# Patient Record
Sex: Male | Born: 1970 | Race: Black or African American | Hispanic: No | Smoking: Former smoker
Health system: Southern US, Community
[De-identification: ages and names within clinical notes are randomized; demographics above are authoritative.]

## PROBLEM LIST (undated history)

## (undated) DIAGNOSIS — G8929 Other chronic pain: Secondary | ICD-10-CM

## (undated) DIAGNOSIS — M545 Low back pain, unspecified: Secondary | ICD-10-CM

## (undated) DIAGNOSIS — M1990 Unspecified osteoarthritis, unspecified site: Secondary | ICD-10-CM

---

## 2008-12-19 ENCOUNTER — Emergency Department (HOSPITAL_COMMUNITY): Admission: EM | Admit: 2008-12-19 | Discharge: 2008-12-19 | Payer: Self-pay | Admitting: Emergency Medicine

## 2014-04-27 ENCOUNTER — Emergency Department (HOSPITAL_COMMUNITY)
Admission: EM | Admit: 2014-04-27 | Discharge: 2014-04-27 | Disposition: A | Discharge status: Home / Self Care | Payer: 59 | Attending: Emergency Medicine | Admitting: Emergency Medicine

## 2014-04-27 ENCOUNTER — Encounter (HOSPITAL_COMMUNITY): Payer: Self-pay | Admitting: Emergency Medicine

## 2014-04-27 DIAGNOSIS — J020 Streptococcal pharyngitis: Secondary | ICD-10-CM | POA: Insufficient documentation

## 2014-04-27 DIAGNOSIS — J029 Acute pharyngitis, unspecified: Secondary | ICD-10-CM | POA: Diagnosis present

## 2014-04-27 LAB — RAPID STREP SCREEN (MED CTR MEBANE ONLY): Streptococcus, Group A Screen (Direct): POSITIVE — AB

## 2014-04-27 MED ORDER — PENICILLIN G BENZATHINE 1200000 UNIT/2ML IM SUSP
1.2000 10*6.[IU] | Freq: Once | INTRAMUSCULAR | Status: AC
  Administered 2014-04-27: 1.2 10*6.[IU] via INTRAMUSCULAR
  Filled 2014-04-27: qty 2

## 2014-04-27 MED ORDER — NAPROXEN 500 MG PO TABS
500.0000 mg | ORAL_TABLET | Freq: Once | ORAL | Status: AC
  Administered 2014-04-27: 500 mg via ORAL
  Filled 2014-04-27: qty 1

## 2014-04-27 MED ORDER — NAPROXEN 500 MG PO TABS: 500.0000 mg | ORAL_TABLET | Freq: Two times a day (BID) | ORAL | Status: DC

## 2014-04-27 NOTE — ED Notes (Signed)
States sore throat since Sunday, increasing pain to throat with swollen glands in upper neck. States has been painful to swallow, took an ibuprofen prior to arrival here

## 2014-04-27 NOTE — Discharge Instructions (Signed)
Please follow the directions provided. Use the resource guide to establish care with a primary care doctor to ensure you're getting better. Take the naproxen twice a day to help with pain and inflammation. Be sure you can continue to drink plenty of fluids by mouth. Don't hesitate to return for any new, worsening, or concerning symptoms.   SEEK IMMEDIATE MEDICAL CARE IF:  You develop any new symptoms such as vomiting, severe headache, stiff or painful neck, chest pain, shortness of breath, or trouble swallowing.  You develop severe throat pain, drooling, or changes in your voice.  You develop swelling of the neck, or the skin on the neck becomes red and tender.  You develop signs of dehydration, such as fatigue, dry mouth, and decreased urination.  You become increasingly sleepy, or you cannot wake up completely.    Emergency Department Resource Guide 1) Find a Doctor and Pay Out of Pocket Although you won't have to find out who is covered by your insurance plan, it is a good idea to ask around and get recommendations. You will then need to call the office and see if the doctor you have chosen will accept you as a new patient and what types of options they offer for patients who are self-pay. Some doctors offer discounts or will set up payment plans for their patients who do not have insurance, but you will need to ask so you aren't surprised when you get to your appointment.  2) Contact Your Local Health Department Not all health departments have doctors that can see patients for sick visits, but many do, so it is worth a call to see if yours does. If you don't know where your local health department is, you can check in your phone book. The CDC also has a tool to help you locate your state's health department, and many state websites also have listings of all of their local health departments.  3) Find a Walk-in Clinic If your illness is not likely to be very severe or complicated, you may want  to try a walk in clinic. These are popping up all over the country in pharmacies, drugstores, and shopping centers. They're usually staffed by nurse practitioners or physician assistants that have been trained to treat common illnesses and complaints. They're usually fairly quick and inexpensive. However, if you have serious medical issues or chronic medical problems, these are probably not your best option.  No Primary Care Doctor: - Call Health Connect at  [phone] - they can help you locate a primary care doctor that  accepts your insurance, provides certain services, etc. - Physician Referral Service- [phone]  Chronic Pain Problems: Organization         Address  Phone   Notes  [facility]  [phone] Patients need to be referred by their primary care doctor.   Medication Assistance: Organization         Address  Phone   Notes  [facility] [address] [place], [place] [id number] [phone]) [phone] --Must be a resident of [place] -- Must have NO insurance coverage whatsoever (no Medicaid/ Medicare, etc.) -- The pt. MUST have a primary care doctor that directs their care regularly and follows them in the community   [facility]ist  [phone]   [employer]  [phone]    Agencies that provide inexpensive medical care: Organization         Address  Phone   Notes  [facility]  [phone]   [facility]    [phone]   [facility] [place], [place] [address] [phone]   [facility] [address], [place] [phone]   Planned Parenthood    [phone]   [facility]    [phone]   [place] and [place] [address], [place] Phone:  [phone], Fax:  [phone] Hours of Operation:  9 am - 6 pm, M-F.  Also accepts Medicaid/Medicare and self-pay.   [facility] [place] for [place] [address], [address], [place] Phone: [phone], Fax: [phone]. Hours of Operation:  8:30 am - 5:30 pm, M-F.  Also accepts Medicaid and self-pay.  [facility] [place] [address] [place] Phone: [phone]   [place], [place], [place] [phone], Ext. [phone] Mondays & Thursdays: 7-9 AM.  First 15 patients are seen on a first come, first serve basis.    [place] Providers:  Organization         Address  Phone   Notes  [facility] [address], [place] [phone] Also accepts self-pay patients.  [facility] [address], [place]  [phone]   [place], [address], [place] [phone]   [facility] [address] [place] [phone]   Lucianne Lei [address], [place]   [phone] Only accepts [place] Access [place] patients after they have their name applied to their card.   Self-Pay (no insurance) in [facility]:  Organization         Address  Phone   Notes  Sickle Cell Patients, [facility] [place] [phone]   [facility] [place] [phone]   [facility] [place]  [place], [place], [place] [phone]   [facility]/Dr. Osei-Bonsu  [address] [place] or [address], [place] [phone] Phone number for both [place] and [place] locations is the same.  [facility] [address], [place] [phone]   [facility] [address] [place] or [address] [phone]   [facility] [address], [place] [phone], phone; [phone], fax Sees patients 1st and 3rd Saturday of every month.  Must not qualify for public or private insurance (i.e. Medicaid, Medicare, [place] Health Choice, Veterans' Benefits)  Household income should be no more than 200% of the poverty level The clinic cannot treat you if you are pregnant or think you are pregnant  Sexually transmitted diseases are not treated at the clinic.    Dental Care: Organization         Address  Phone  Notes  [facility] Department of [facility] [phone] Accepts children up to age 80 who are enrolled in [place] or [place]; pregnant women with a Medicaid card; and children who have applied for Medicaid or  Health Choice, but were declined, whose parents can pay a reduced fee at time of service.  [facility] Department of  [facility]  [address], [place] [phone] Accepts children up to age 81 who are enrolled in Medicaid or [place]; pregnant women with a Medicaid card; and children who have applied for Medicaid or [place] Health Choice, but were declined, whose parents can pay a reduced fee at time of service.  [facility]  [place] [phone] Patients are seen by appointment only. Walk-ins are not accepted. [place] will see patients 26 years of age and older. Monday - Tuesday (8am-5pm) Most Wednesdays (8:30-5pm) $30 per visit, cash only  [facility] Adult [facility] Access PROGRAM  [address], [facility] [phone] Patients are seen by appointment only. Walk-ins are not accepted. [place] will see patients 48 years of age and older. One Wednesday Evening (Monthly: Volunteer Based).  $30 per visit, cash only  [place]  [phone] for adults; Children under age 78, call Graduate Pediatric Dentistry at [phone]. Children aged 19-14, please call [phone] to request a pediatric application.  Dental services are provided in all areas of dental care including fillings, crowns and bridges, complete and partial dentures, implants, gum treatment, root canals, and extractions. Preventive care is also provided. Treatment is provided to both adults and children. Patients are selected via a lottery and there is often a waiting list.   [facility] [address], [place]  [phone] [email or web address]   [facility] [address] [place], [place] [phone], Ext. [phone] Second and Fourth Thursday of each month, opens at 6:30 AM; Clinic ends at 9 AM.  Patients are seen on a first-come first-served basis, and a limited number are seen during each clinic.   [facility]  [address] [place] Danker [place], [place] [phone]   Eligibility Requirements You must have lived in [place], [place], or [place] counties for at least the last three months.   You cannot be eligible for state or federal sponsored [employer], including [employer], [place], or [employer].   You generally cannot be eligible for healthcare insurance through your employer.    How to apply: Eligibility screenings are held every Tuesday and Wednesday afternoon from 1:00 pm until 4:00 pm. You do not need an appointment for the interview!  [facility] [address] [place], [place]   [place]  [place] Department  [place]  [phone]    Behavioral Health Resources in the Community: Intensive Outpatient Programs Organization         Address  Phone  Notes  [place] [facility]. [address] [place], [place] [phone]   [facility] [address], [place], [place]   [facility] [address] [place], [place]    [place] [address]  [place], [place] or [phone]   Substance Abuse Resources Organization         Address  Phone  Notes  [facility]  [phone]   [place]  [phone]   [facility] [place]   Chinita Pester  [phone]   Residential & Outpatient Substance Abuse Program  [phone]   Psychological Services Organization         Address  Phone  Notes  [place]  [phone]- [phone]   °[facility]  336- [phone]   °[place] [facility] [address], [place] 1-800-853-5163 or [phone]   ° °Mobile Crisis Teams °Organization         Address  Phone  Notes  °[facility], Mobile Crisis Care Unit  1-877-626-1772   °Assertive °[facility]otherapeutic Services ° [address] [place], [place] [phone]   °Sharon DeEsch [address] °[place] [phone]   ° °Self-Help/Support Groups °Organization         Address  Phone             Notes  °[facility] [place] - variety of support groups  336- [phone] Call for more information  °Narcotics Anonymous (NA), [facility] [address], °[place]  2 meetings at this location  ° °Residential Treatment Programs °Organization         Address  Phone  Notes  °[facility] [address],    °[place]  1-866-801-8205   °[facility] ° [address], [place], [place] [phone]   °[facility] [address], [place] [phone] Admissions: 8am-3pm M-F  °[facility] [address]    °[place], [place] [phone]   °[facility] [address], [place], [place] [phone]   °[facility] [address]  °[place], [place] [phone]   °[facility] - Intensive Outpatient [address], [place], [place] [phone]   °[facility] [address]  °[place], [place] 1-877-615-2722 or 336-784-9470   °[facility] ([facility]) [address] [place], [place] [phone] Accepts Medicaid  °[facility] [address]  °[place] 1-800-659-3381 Substance Abuse/Addiction Treatment  ° °[place] Behavioral Health Resources °Organization         Address  Phone  Notes  °[facility]  (888) [phone]   °Julie Brannon, PhD [address] [place], [place]   (336) [phone] or (336) 951-0000   °[facility] [facility]   [address] °[place], [place] (336) 349-4454   °[facility] [address], [place], [place] (336) [phone] Insurance/Medicaid/sponsorship through [facility]  °[facility] [address]                                    [place], [place] (336) [phone] Therapy/tele-psych/case  °[facility] [address]  ° [place], [place] (336) [phone]    °Dr. Arfeen  (336) [phone]   °[facility] [place]  [employer] [place] [facility] 1) [address], [place] °2) [address], [place] °3)  [address], [place] (336) 349-3220 °(336) [phone] ° °(336) 342-8140   °[place] Child Abuse Hotline (336) 342-1394 or (336) 342-3537 (After Hours)    ° ° ° °

## 2014-04-27 NOTE — ED Provider Notes (Signed)
CSN: [id number]     Arrival date & time 04/27/14  2025 History  This chart was scribed for non-physician practitioner, Harle Battiest, NP working with Tilden Fossa, MD by Gwenyth Ober, ED scribe. This patient was seen in room WTR8/WTR8 and the patient's care was started at 9:30 PM  Chief Complaint  Patient presents with  . Sore Throat   The history is provided by the patient. No language interpreter was used.   HPI Comments: Chad Morrow is a 44 y.o. male who presents to the Emergency Department complaining of constant, waxing and waning, 5/10 sore throat that started 2 days ago. He states subjective fever and pain with swallowing as associated symptoms. Pt tried Ibuprofen PTA with relief to his pain. He has been drinking fluids normally. Pt denies nausea and vomiting as associated symptoms.   No PCP  History reviewed. No pertinent past medical history. History reviewed. No pertinent past surgical history. History reviewed. No pertinent family history. History  Substance Use Topics  . Smoking status: Not on file  . Smokeless tobacco: Not on file  . Alcohol Use: Not on file    Review of Systems  Constitutional: Positive for fever.  HENT: Positive for sore throat and trouble swallowing.   Gastrointestinal: Negative for nausea and vomiting.    Allergies  Review of patient's allergies indicates no known allergies.  Home Medications   Prior to Admission medications   Not on File   BP 154/84 mmHg  Pulse 70  Temp(Src) 98.6 F (37 C) (Oral)  Resp 18  SpO2 97% Physical Exam  Constitutional: He is oriented to person, place, and time. He appears well-developed and well-nourished. No distress.  HENT:  Head: Normocephalic and atraumatic.  Mouth/Throat: Oropharynx is clear and moist. No oropharyngeal exudate.  No peritonsillar abscess; tonsillar and cervical lymphadenopathy; erythema but no exudate to oropharynx  Eyes: Pupils are equal, round, and reactive to light.   Neck: Neck supple.  Cardiovascular: Normal rate.   Pulmonary/Chest: Effort normal.  Musculoskeletal: He exhibits no edema.  Neurological: He is alert and oriented to person, place, and time. No cranial nerve deficit.  Skin: Skin is warm and dry. No rash noted.  Psychiatric: He has a normal mood and affect. His behavior is normal.  Nursing note and vitals reviewed.   ED Course  Procedures  DIAGNOSTIC STUDIES: Oxygen Saturation is 97% on RA, normal by my interpretation.    COORDINATION OF CARE: 9:37 PM Discussed positive strep test and treatment plan with pt. He agreed to plan.  Labs Review Labs Reviewed  RAPID STREP SCREEN - Abnormal; Notable for the following:    Streptococcus, Group A Screen (Direct) POSITIVE (*)    All other components within normal limits    Imaging Review No results found.   EKG Interpretation None      MDM   Final diagnoses:  Strep pharyngitis   44 yo with sore throat, chills, and difficulty eating. He is afebrile but has cervical lymphadenopathy, & positive strep test. Treated in the ED with NSAIDs and PCN IM.  Discussed importance of hydration with water. Presentation non concerning for PTA or infxn spread to soft tissue. No trismus or uvula deviation. Specific return precautions discussed. Pt able to drink water in ED without difficulty with intact air way. Pt is well-appearing, in no acute distress and vital signs reviewed and not concerning. He appears safe to be discharged.  Discharge include resources to follow-up with a PCP.  Return precautions provided.   I  personally performed the services described in this documentation, which was scribed in my presence. The recorded information has been reviewed and is accurate.  Filed Vitals:   04/27/14 2037 04/27/14 2148  BP: 154/84 166/85  Pulse: 70 84  Temp: 98.6 F (37 C) 98.5 F (36.9 C)  TempSrc: Oral Oral  Resp: 18 20  SpO2: 97% 99%   Meds given in ED:  Medications  penicillin g  benzathine (BICILLIN LA) 1200000 UNIT/2ML injection 1.2 Million Units (1.2 Million Units Intramuscular Given 04/27/14 2146)  naproxen (NAPROSYN) tablet 500 mg (500 mg Oral Given 04/27/14 2146)    Discharge Medication List as of 04/27/2014  9:45 PM    START taking these medications   Details  naproxen (NAPROSYN) 500 MG tablet Take 1 tablet (500 mg total) by mouth 2 (two) times daily., Starting 04/27/2014, Until Discontinued, Print          Harle BattiestElizabeth Tywanda Rice, NP 04/28/14 1709  Tilden FossaElizabeth Rees, MD 04/30/14 [phone]

## 2015-03-07 ENCOUNTER — Ambulatory Visit: Payer: Worker's Compensation

## 2015-03-07 ENCOUNTER — Ambulatory Visit (INDEPENDENT_AMBULATORY_CARE_PROVIDER_SITE_OTHER): Payer: Worker's Compensation | Admitting: Emergency Medicine

## 2015-03-07 VITALS — BP 142/92 | HR 87 | Temp 98.6°F | Resp 16 | Ht 69.0 in | Wt 220.0 lb

## 2015-03-07 DIAGNOSIS — S8392XA Sprain of unspecified site of left knee, initial encounter: Secondary | ICD-10-CM

## 2015-03-07 DIAGNOSIS — S39012A Strain of muscle, fascia and tendon of lower back, initial encounter: Secondary | ICD-10-CM

## 2015-03-07 DIAGNOSIS — S338XXA Sprain of other parts of lumbar spine and pelvis, initial encounter: Secondary | ICD-10-CM

## 2015-03-07 MED ORDER — CYCLOBENZAPRINE HCL 5 MG PO TABS: 5.0000 mg | ORAL_TABLET | Freq: Three times a day (TID) | ORAL | Status: DC | PRN

## 2015-03-07 MED ORDER — NAPROXEN SODIUM 550 MG PO TABS: 550.0000 mg | ORAL_TABLET | Freq: Two times a day (BID) | ORAL | Status: DC

## 2015-03-07 NOTE — Patient Instructions (Signed)
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
## 2015-03-11 ENCOUNTER — Ambulatory Visit (INDEPENDENT_AMBULATORY_CARE_PROVIDER_SITE_OTHER): Payer: Worker's Compensation | Admitting: Family Medicine

## 2015-03-11 VITALS — BP 132/80 | HR 87 | Temp 97.9°F | Resp 20 | Ht 69.29 in | Wt 219.8 lb

## 2015-03-11 DIAGNOSIS — S39012D Strain of muscle, fascia and tendon of lower back, subsequent encounter: Secondary | ICD-10-CM

## 2015-03-11 DIAGNOSIS — M545 Low back pain, unspecified: Secondary | ICD-10-CM

## 2015-03-11 MED ORDER — NAPROXEN SODIUM 550 MG PO TABS
550.0000 mg | ORAL_TABLET | Freq: Two times a day (BID) | ORAL | Status: DC
Start: 2015-03-11 — End: 2015-04-01

## 2015-03-11 MED ORDER — CYCLOBENZAPRINE HCL 5 MG PO TABS: 5.0000 mg | ORAL_TABLET | Freq: Three times a day (TID) | ORAL | Status: DC | PRN
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
## 2015-03-11 NOTE — Patient Instructions (Signed)
We are going to treat your back and leg pain with flexeril (muscle relaxer) and naproxen for pain Avoid lifting, but move around frequently and gently stretch your back.  Heat can also help The flexeril may make you sleepy- be careful of this side effect!  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

## 2015-03-18 ENCOUNTER — Ambulatory Visit (INDEPENDENT_AMBULATORY_CARE_PROVIDER_SITE_OTHER): Payer: Worker's Compensation | Admitting: Family Medicine

## 2015-03-18 VITALS — BP 122/81 | HR 84 | Temp 98.6°F | Resp 20 | Ht 69.0 in | Wt 223.2 lb

## 2015-03-18 DIAGNOSIS — S39012D Strain of muscle, fascia and tendon of lower back, subsequent encounter: Secondary | ICD-10-CM

## 2015-03-18 DIAGNOSIS — S8392XA Sprain of unspecified site of left knee, initial encounter: Secondary | ICD-10-CM
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
## 2015-03-25 ENCOUNTER — Ambulatory Visit (INDEPENDENT_AMBULATORY_CARE_PROVIDER_SITE_OTHER): Payer: Worker's Compensation | Admitting: Family Medicine

## 2015-03-25 VITALS — BP 128/84 | HR 88 | Temp 98.5°F | Resp 18 | Wt 222.2 lb

## 2015-03-25 DIAGNOSIS — M545 Low back pain, unspecified: Secondary | ICD-10-CM

## 2015-03-25 DIAGNOSIS — S39012D Strain of muscle, fascia and tendon of lower back, subsequent encounter: Secondary | ICD-10-CM | POA: Diagnosis not present

## 2015-03-25 DIAGNOSIS — M5431 Sciatica, right side: Secondary | ICD-10-CM | POA: Diagnosis not present

## 2015-03-25 MED ORDER — CYCLOBENZAPRINE HCL 5 MG PO TABS: 5.0000 mg | ORAL_TABLET | Freq: Three times a day (TID) | ORAL | Status: DC | PRN

## 2015-03-25 MED ORDER — PREDNISONE 20 MG PO TABS: ORAL_TABLET | ORAL | Status: DC

## 2015-03-25 NOTE — Patient Instructions (Signed)
Continue to use the flexeril as needed but not when you are driving We will use an 8 day course of prednisone for any pinched nerve that may be contributing to your symptoms- this will hopefully help with the pain and numbness down the right leg Please call the PT office today and try to get an appointment Continue current work restrictions Please come and see [place] in 1 week Remember while you are on the prednisone no ibuprofen or naproxen- tylenol is ok however.
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
## 2015-04-01 ENCOUNTER — Ambulatory Visit (INDEPENDENT_AMBULATORY_CARE_PROVIDER_SITE_OTHER): Payer: Worker's Compensation | Admitting: Physician Assistant

## 2015-04-01 VITALS — BP 126/80 | HR 70 | Temp 97.9°F | Resp 16 | Ht 69.29 in | Wt 221.8 lb

## 2015-04-01 DIAGNOSIS — M545 Low back pain, unspecified: Secondary | ICD-10-CM

## 2015-04-01 DIAGNOSIS — S39012D Strain of muscle, fascia and tendon of lower back, subsequent encounter: Secondary | ICD-10-CM

## 2015-04-01 MED ORDER — NAPROXEN SODIUM 550 MG PO TABS: 550.0000 mg | ORAL_TABLET | Freq: Two times a day (BID) | ORAL | Status: DC

## 2015-04-01 MED ORDER — CYCLOBENZAPRINE HCL 5 MG PO TABS: 5.0000 mg | ORAL_TABLET | Freq: Three times a day (TID) | ORAL | Status: DC | PRN
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
## 2015-04-04 ENCOUNTER — Other Ambulatory Visit: Payer: Self-pay | Admitting: Family Medicine

## 2015-04-06 ENCOUNTER — Other Ambulatory Visit: Payer: Self-pay | Admitting: Family Medicine

## 2015-04-29 ENCOUNTER — Ambulatory Visit (INDEPENDENT_AMBULATORY_CARE_PROVIDER_SITE_OTHER): Payer: Worker's Compensation | Admitting: Physician Assistant

## 2015-04-29 VITALS — BP 114/72 | HR 77 | Temp 97.8°F | Resp 18 | Ht 69.25 in | Wt 222.4 lb

## 2015-04-29 DIAGNOSIS — M545 Low back pain, unspecified: Secondary | ICD-10-CM

## 2015-04-29 DIAGNOSIS — S39012D Strain of muscle, fascia and tendon of lower back, subsequent encounter: Secondary | ICD-10-CM

## 2015-04-29 MED ORDER — CYCLOBENZAPRINE HCL 5 MG PO TABS: 5.0000 mg | ORAL_TABLET | Freq: Three times a day (TID) | ORAL | Status: DC | PRN
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
## 2015-08-14 ENCOUNTER — Encounter (HOSPITAL_COMMUNITY): Payer: Self-pay

## 2015-08-14 ENCOUNTER — Inpatient Hospital Stay (HOSPITAL_COMMUNITY)
Admission: EM | Admit: 2015-08-14 | Discharge: 2015-08-17 | DRG: 395 | Disposition: A | Discharge status: Home Health | Payer: Self-pay | Attending: General Surgery | Admitting: General Surgery

## 2015-08-14 DIAGNOSIS — Z79899 Other long term (current) drug therapy: Secondary | ICD-10-CM

## 2015-08-14 DIAGNOSIS — K611 Rectal abscess: Secondary | ICD-10-CM | POA: Diagnosis present

## 2015-08-14 DIAGNOSIS — D72829 Elevated white blood cell count, unspecified: Secondary | ICD-10-CM | POA: Diagnosis present

## 2015-08-14 DIAGNOSIS — K5900 Constipation, unspecified: Secondary | ICD-10-CM | POA: Diagnosis present

## 2015-08-14 DIAGNOSIS — K612 Anorectal abscess: Principal | ICD-10-CM | POA: Diagnosis present

## 2015-08-14 HISTORY — DX: Unspecified osteoarthritis, unspecified site: M19.90

## 2015-08-14 HISTORY — DX: Other chronic pain: G89.29

## 2015-08-14 HISTORY — DX: Low back pain: M54.5

## 2015-08-14 HISTORY — DX: Low back pain, unspecified: M54.50

## 2015-08-14 LAB — I-STAT CHEM 8, ED
BUN: 7 mg/dL (ref 6–20)
CHLORIDE: 93 mmol/L — AB (ref 101–111)
Calcium, Ion: 1.01 mmol/L — ABNORMAL LOW (ref 1.12–1.23)
Creatinine, Ser: 1.1 mg/dL (ref 0.61–1.24)
Glucose, Bld: 106 mg/dL — ABNORMAL HIGH (ref 65–99)
HEMATOCRIT: 51 % (ref 39.0–52.0)
Hemoglobin: 17.3 g/dL — ABNORMAL HIGH (ref 13.0–17.0)
Potassium: 3.8 mmol/L (ref 3.5–5.1)
Sodium: 133 mmol/L — ABNORMAL LOW (ref 135–145)
TCO2: 28 mmol/L (ref 0–100)

## 2015-08-14 LAB — CBC
HCT: 46.5 % (ref 39.0–52.0)
HEMOGLOBIN: 16.1 g/dL (ref 13.0–17.0)
MCH: 29.7 pg (ref 26.0–34.0)
MCHC: 34.6 g/dL (ref 30.0–36.0)
MCV: 85.8 fL (ref 78.0–100.0)
Platelets: 246 10*3/uL (ref 150–400)
RBC: 5.42 MIL/uL (ref 4.22–5.81)
RDW: 12.8 % (ref 11.5–15.5)
WBC: 10.8 10*3/uL — ABNORMAL HIGH (ref 4.0–10.5)

## 2015-08-14 MED ORDER — MILK AND MOLASSES ENEMA
1.0000 | Freq: Once | RECTAL | Status: AC
  Administered 2015-08-14: 250 mL via RECTAL
  Filled 2015-08-14: qty 250

## 2015-08-14 NOTE — ED Provider Notes (Signed)
CSN: [id number]     Arrival date & time 08/14/15  1920 History   First MD Initiated Contact with Patient 08/14/15 2030     Chief Complaint  Patient presents with  . Constipation     (Consider location/radiation/quality/duration/timing/severity/associated sxs/prior Treatment) HPI Comments: 45 year old male with no significant past medical history presents to the emergency department for evaluation of constipation. Patient states that he has had difficulty having a bowel movement over the past week, since receiving a steroid injection for back pain. He reports giving himself an over-the-counter enema earlier today with some passage of stool. He feels that there is a large amount of stool, however, that he still needs to pass. He reports a normal appetite without nausea or vomiting. He has had no fever or complaints of abdominal pain. No bowel or bladder incontinence or inability to ambulate. No hx of abdominal surgeries.  Patient is a 45 y.o. male presenting with constipation. The history is provided by the patient. No language interpreter was used.  Constipation   History reviewed. No pertinent past medical history. History reviewed. No pertinent past surgical history. History reviewed. No pertinent family history. Social History  Substance Use Topics  . Smoking status: Never Smoker   . Smokeless tobacco: None  . Alcohol Use: 3.6 oz/week    0 Standard drinks or equivalent, 6 Cans of beer per week    Review of Systems  Gastrointestinal: Positive for constipation.  All other systems reviewed and are negative.   Allergies  Review of patient's allergies indicates no known allergies.  Home Medications   Prior to Admission medications   Medication Sig Start Date End Date Taking? Authorizing Provider  bisacodyl (BISACODYL) 5 MG EC tablet Take 5 mg by mouth daily as needed for moderate constipation.   Yes Historical Provider, MD  cyclobenzaprine (FLEXERIL) 5 MG tablet Take 1 tablet (5  mg total) by mouth 3 (three) times daily as needed for muscle spasms. 04/29/15  Yes Dorna Leitz, PA-C  magnesium citrate SOLN Take 1 Bottle by mouth once.   Yes Historical Provider, MD  naproxen sodium (ANAPROX) 220 MG tablet Take 440 mg by mouth daily as needed (for pain).   Yes Historical Provider, MD  PEDIALYTE (PEDIALYTE) SOLN Take 240 mLs by mouth daily as needed (for electrolyte replinishment).   Yes Historical Provider, MD  psyllium (METAMUCIL) 58.6 % powder Take 1 packet by mouth daily as needed (for constipation).   Yes Historical Provider, MD  Sodium Phosphates ([employer] ENEMA DISPOSABLE RE) Place 1 each rectally daily as needed (for constipation).   Yes Historical Provider, MD   BP 120/83 mmHg  Pulse 84  Temp(Src) 98.5 F (36.9 C) (Oral)  Resp 16  SpO2 98%   Physical Exam  Constitutional: He is oriented to person, place, and time. He appears well-developed and well-nourished. No distress.  Nontoxic appearing and in no distress.  HENT:  Head: Normocephalic and atraumatic.  Eyes: Conjunctivae and EOM are normal. No scleral icterus.  Neck: Normal range of motion.  Cardiovascular: Normal rate, regular rhythm and intact distal pulses.   Heart rate 96bpm at rest at beginning of encounter. HR increased to 113bpm at the end of the encounter; patient reports getting anxious when around medical personnel.   Pulmonary/Chest: Effort normal. No respiratory distress. He has no wheezes.  Respirations even and unlabored  Abdominal: Soft. He exhibits no distension. There is no tenderness. There is no rebound and no guarding.  Soft, nontender abdomen. No masses. No peritoneal signs.  Musculoskeletal: Normal range of motion.  Neurological: He is alert and oriented to person, place, and time. He exhibits normal muscle tone. Coordination normal.  GCS 15. Patient moving all extremities.  Skin: Skin is warm and dry. No rash noted. He is not diaphoretic. No erythema. No pallor.  Psychiatric: He has a  normal mood and affect. His behavior is normal.  Nursing note and vitals reviewed.   ED Course  Procedures (including critical care time) Labs Review Labs Reviewed  CBC - Abnormal; Notable for the following:    WBC 10.8 (*)    All other components within normal limits  I-STAT CHEM 8, ED - Abnormal; Notable for the following:    Sodium 133 (*)    Chloride 93 (*)    Glucose, Bld 106 (*)    Calcium, Ion 1.01 (*)    Hemoglobin 17.3 (*)    All other components within normal limits    Imaging Review Ct Pelvis W Contrast  08/15/2015  CLINICAL DATA:  Acute onset of rectal pain. Recent cortisone shot at the lower back. Initial encounter. EXAM: CT PELVIS WITH CONTRAST TECHNIQUE: Multidetector CT imaging of the pelvis was performed using the standard protocol following the bolus administration of intravenous contrast. CONTRAST:  100mL ISOVUE-300 IOPAMIDOL (ISOVUE-300) INJECTION 61% COMPARISON:  None. FINDINGS: A complex peripherally enhancing abscess is noted extending about both sides of the the anorectal canal, measuring 7.2 cm on the right and 5.6 cm on the left, with surrounding soft tissue inflammation. There is extension to the edge of the anorectal canal posteriorly. The visualized small and large bowel are grossly unremarkable. The bladder is mildly distended and grossly unremarkable in appearance. The prostate remains normal in size. No inguinal lymphadenopathy is seen. No acute osseous abnormalities are identified. IMPRESSION: Complex peripherally enhancing abscess noted extending about the both sides of the anorectal canal, measuring 7.2 cm in length on the right and 5.6 cm in length on the left, with surrounding soft tissue inflammation. This extends to the edge of the anorectal canal posteriorly. Electronically Signed   By: Roanna RaiderJeffery  Chang M.D.   On: 08/15/2015 03:24     I have personally reviewed and evaluated these images and lab results as part of my medical decision-making.   EKG  Interpretation None      11:06 PM Patient with moderate BM after enema. He continues to feel as though "something is putting pressure there". At this time, patient agreed to manual disimpaction; however no stool noted in the rectal vault. Patient pointing to his right gluteal region at this time to communicate location of this pressure sensation. On further exam, patient tender at lower coccygeal region as well as to his right intergluteal cleft. He has no area of firmness or induration to suggest perianal abscess or pilonidal cyst. Will obtain CT pelvis to evaluate for perirectal abscess.  3:44 AM Patient with evidence of a complex abscess extending about both sides of the anorectal canal. Will consult CCS for admission.  MDM   Final diagnoses:  Perirectal abscess    45 year old male presents to the emergency department for sensation of constipation. Patient initially reported lack of a bowel movement for one week. He had some passage of stool prior to arrival after self administering a Fleet enema at home. Initially, symptoms were managed with a milk and molasses enema. Despite passage of stool, patient continued to complain of pressure in his rectum. Chaperoned DRE was performed without evidence of stool in the rectal vault. Upon further  exam, patient was found to be tender in his coccygeal region as well as his right intergluteal cleft. No distinct area of induration was appreciated on exam at this time.  CT scan obtained which shows an extensive anorectal abscess extending 7.2 cm in length on the right and 5.6 cm in length on the left. Pain has been well controlled with Toradol. Patient was also found to have a mild leukocytosis of 10.8. Patient has remained NPO while in the department. He does have a hx of instrumentation 1 week ago; he reports receiving a localized steroid injection in his L-spine for pain secondary to a work related injury. Area of this injection does not specifically  correlate with perirectal abscess. Coverage initiated with IV Zosyn; will hold vancomycin for now. General surgery consulted to discuss surgical management. Dr. Corliss Skains to evaluate in the ED for admission.    Filed Vitals:   08/15/15 0115 08/15/15 0310 08/15/15 0315 08/15/15 0330  BP: 114/72 120/83 121/83 119/80  Pulse: 94 84 86 79  Temp:      TempSrc:      Resp:  16    SpO2: 97% 98% 98% 99%     Antony Madura, PA-C 08/15/15 0436  Antony Madura, PA-C 08/15/15 1610  Arby Barrette, MD 08/17/15 0006

## 2015-08-14 NOTE — ED Notes (Signed)
Pt states recent steroid injection by ortho md. Since, had issues with having a bowel movement. Denies incontinence, states feeling of constipation. States last BM 1 week ago.

## 2015-08-15 ENCOUNTER — Encounter (HOSPITAL_COMMUNITY): Payer: Self-pay | Admitting: General Surgery

## 2015-08-15 ENCOUNTER — Emergency Department (HOSPITAL_COMMUNITY): Payer: Self-pay

## 2015-08-15 ENCOUNTER — Inpatient Hospital Stay (HOSPITAL_COMMUNITY): Payer: MEDICAID | Admitting: Anesthesiology

## 2015-08-15 ENCOUNTER — Encounter (HOSPITAL_COMMUNITY): Admission: EM | Disposition: A | Discharge status: Home Health | Payer: Self-pay | Source: Home / Self Care

## 2015-08-15 DIAGNOSIS — K611 Rectal abscess: Secondary | ICD-10-CM | POA: Diagnosis present

## 2015-08-15 HISTORY — PX: INCISION AND DRAINAGE PERIRECTAL ABSCESS: SHX1804

## 2015-08-15 LAB — SURGICAL PCR SCREEN
MRSA, PCR: NEGATIVE
STAPHYLOCOCCUS AUREUS: NEGATIVE

## 2015-08-15 LAB — CBC
HEMATOCRIT: 46.5 % (ref 39.0–52.0)
Hemoglobin: 16 g/dL (ref 13.0–17.0)
MCH: 29.8 pg (ref 26.0–34.0)
MCHC: 34.4 g/dL (ref 30.0–36.0)
MCV: 86.6 fL (ref 78.0–100.0)
Platelets: 250 10*3/uL (ref 150–400)
RBC: 5.37 MIL/uL (ref 4.22–5.81)
RDW: 13 % (ref 11.5–15.5)
WBC: 11.9 10*3/uL — ABNORMAL HIGH (ref 4.0–10.5)

## 2015-08-15 LAB — CREATININE, SERUM: CREATININE: 1.25 mg/dL — AB (ref 0.61–1.24)

## 2015-08-15 LAB — GLUCOSE, CAPILLARY: Glucose-Capillary: 161 mg/dL — ABNORMAL HIGH (ref 65–99)

## 2015-08-15 SURGERY — INCISION AND DRAINAGE, ABSCESS, PERIRECTAL
Anesthesia: General | Site: Rectum

## 2015-08-15 MED ORDER — NAPROXEN SODIUM 275 MG PO TABS
440.0000 mg | ORAL_TABLET | Freq: Every day | ORAL | Status: DC | PRN
  Filled 2015-08-15: qty 2

## 2015-08-15 MED ORDER — MIDAZOLAM HCL 5 MG/5ML IJ SOLN
INTRAMUSCULAR | Status: DC | PRN
  Administered 2015-08-15: 2 mg via INTRAVENOUS

## 2015-08-15 MED ORDER — LIDOCAINE HCL (CARDIAC) 20 MG/ML IV SOLN
INTRAVENOUS | Status: DC | PRN
  Administered 2015-08-15: 80 mg via INTRAVENOUS

## 2015-08-15 MED ORDER — PROMETHAZINE HCL 25 MG/ML IJ SOLN: 6.2500 mg | INTRAMUSCULAR | Status: DC | PRN

## 2015-08-15 MED ORDER — IOPAMIDOL (ISOVUE-300) INJECTION 61%
INTRAVENOUS | Status: AC
  Administered 2015-08-15: 100 mL
  Filled 2015-08-15: qty 100

## 2015-08-15 MED ORDER — LACTATED RINGERS IV SOLN
INTRAVENOUS | Status: DC
  Administered 2015-08-15 (×2): via INTRAVENOUS

## 2015-08-15 MED ORDER — SODIUM CHLORIDE 0.9 % IR SOLN
Status: DC | PRN
  Administered 2015-08-15: 1000 mL

## 2015-08-15 MED ORDER — POTASSIUM CHLORIDE IN NACL 20-0.9 MEQ/L-% IV SOLN
INTRAVENOUS | Status: DC
  Administered 2015-08-15 (×2): via INTRAVENOUS
  Filled 2015-08-15 (×3): qty 1000

## 2015-08-15 MED ORDER — HYDROMORPHONE HCL 1 MG/ML IJ SOLN
1.0000 mg | INTRAMUSCULAR | Status: DC | PRN
  Administered 2015-08-15 – 2015-08-16 (×3): 1 mg via INTRAVENOUS
  Filled 2015-08-15 (×3): qty 1

## 2015-08-15 MED ORDER — DIPHENHYDRAMINE HCL 25 MG PO CAPS: 25.0000 mg | ORAL_CAPSULE | Freq: Four times a day (QID) | ORAL | Status: DC | PRN

## 2015-08-15 MED ORDER — 0.9 % SODIUM CHLORIDE (POUR BTL) OPTIME
TOPICAL | Status: DC | PRN
  Administered 2015-08-15: 1000 mL

## 2015-08-15 MED ORDER — FENTANYL CITRATE (PF) 250 MCG/5ML IJ SOLN
INTRAMUSCULAR | Status: AC
  Filled 2015-08-15: qty 5

## 2015-08-15 MED ORDER — ONDANSETRON HCL 4 MG/2ML IJ SOLN
4.0000 mg | Freq: Four times a day (QID) | INTRAMUSCULAR | Status: DC | PRN
  Administered 2015-08-15: 4 mg via INTRAVENOUS

## 2015-08-15 MED ORDER — DIPHENHYDRAMINE HCL 50 MG/ML IJ SOLN: 25.0000 mg | Freq: Four times a day (QID) | INTRAMUSCULAR | Status: DC | PRN

## 2015-08-15 MED ORDER — CYCLOBENZAPRINE HCL 5 MG PO TABS: 5.0000 mg | ORAL_TABLET | Freq: Three times a day (TID) | ORAL | Status: DC | PRN

## 2015-08-15 MED ORDER — KETOROLAC TROMETHAMINE 30 MG/ML IJ SOLN
30.0000 mg | Freq: Once | INTRAMUSCULAR | Status: AC
  Administered 2015-08-15: 30 mg via INTRAVENOUS
  Filled 2015-08-15: qty 1

## 2015-08-15 MED ORDER — BUPIVACAINE-EPINEPHRINE (PF) 0.25% -1:200000 IJ SOLN
INTRAMUSCULAR | Status: AC
  Filled 2015-08-15: qty 30

## 2015-08-15 MED ORDER — BUPIVACAINE-EPINEPHRINE 0.25% -1:200000 IJ SOLN
INTRAMUSCULAR | Status: DC | PRN
  Administered 2015-08-15: 30 mL

## 2015-08-15 MED ORDER — FENTANYL CITRATE (PF) 100 MCG/2ML IJ SOLN: 25.0000 ug | INTRAMUSCULAR | Status: DC | PRN

## 2015-08-15 MED ORDER — PROPOFOL 10 MG/ML IV BOLUS
INTRAVENOUS | Status: AC
  Filled 2015-08-15: qty 20

## 2015-08-15 MED ORDER — PSYLLIUM 95 % PO PACK
1.0000 | PACK | Freq: Three times a day (TID) | ORAL | Status: DC
  Administered 2015-08-15 – 2015-08-17 (×6): 1 via ORAL
  Filled 2015-08-15 (×7): qty 1

## 2015-08-15 MED ORDER — PIPERACILLIN-TAZOBACTAM 3.375 G IVPB 30 MIN
3.3750 g | Freq: Once | INTRAVENOUS | Status: AC
  Administered 2015-08-15: 3.375 g via INTRAVENOUS
  Filled 2015-08-15: qty 50

## 2015-08-15 MED ORDER — BISACODYL 5 MG PO TBEC: 5.0000 mg | DELAYED_RELEASE_TABLET | Freq: Every day | ORAL | Status: DC | PRN

## 2015-08-15 MED ORDER — FENTANYL CITRATE (PF) 100 MCG/2ML IJ SOLN
INTRAMUSCULAR | Status: DC | PRN
  Administered 2015-08-15 (×5): 50 ug via INTRAVENOUS

## 2015-08-15 MED ORDER — PIPERACILLIN-TAZOBACTAM 3.375 G IVPB
3.3750 g | Freq: Three times a day (TID) | INTRAVENOUS | Status: DC
  Administered 2015-08-15 – 2015-08-17 (×6): 3.375 g via INTRAVENOUS
  Filled 2015-08-15 (×9): qty 50

## 2015-08-15 MED ORDER — ENOXAPARIN SODIUM 40 MG/0.4ML ~~LOC~~ SOLN
40.0000 mg | SUBCUTANEOUS | Status: DC
  Administered 2015-08-16 – 2015-08-17 (×2): 40 mg via SUBCUTANEOUS
  Filled 2015-08-15 (×2): qty 0.4

## 2015-08-15 MED ORDER — CEFUROXIME SODIUM 1.5 G IJ SOLR
1.5000 g | INTRAMUSCULAR | Status: DC
  Filled 2015-08-15: qty 1.5

## 2015-08-15 MED ORDER — PHENYLEPHRINE 40 MCG/ML (10ML) SYRINGE FOR IV PUSH (FOR BLOOD PRESSURE SUPPORT)
PREFILLED_SYRINGE | INTRAVENOUS | Status: AC
  Filled 2015-08-15: qty 10

## 2015-08-15 MED ORDER — OXYCODONE HCL 5 MG PO TABS
5.0000 mg | ORAL_TABLET | ORAL | Status: DC | PRN
  Administered 2015-08-15 – 2015-08-17 (×4): 10 mg via ORAL
  Filled 2015-08-15 (×4): qty 2

## 2015-08-15 MED ORDER — DEXMEDETOMIDINE HCL IN NACL 200 MCG/50ML IV SOLN
INTRAVENOUS | Status: AC
  Filled 2015-08-15: qty 50

## 2015-08-15 MED ORDER — ROCURONIUM BROMIDE 50 MG/5ML IV SOLN
INTRAVENOUS | Status: AC
  Filled 2015-08-15: qty 1

## 2015-08-15 MED ORDER — PROPOFOL 10 MG/ML IV BOLUS
INTRAVENOUS | Status: DC | PRN
  Administered 2015-08-15: 200 mg via INTRAVENOUS

## 2015-08-15 MED ORDER — MIDAZOLAM HCL 2 MG/2ML IJ SOLN
INTRAMUSCULAR | Status: AC
  Filled 2015-08-15: qty 2

## 2015-08-15 MED ORDER — ONDANSETRON 4 MG PO TBDP
4.0000 mg | ORAL_TABLET | Freq: Four times a day (QID) | ORAL | Status: DC | PRN
Start: 2015-08-15 — End: 2015-08-17

## 2015-08-15 SURGICAL SUPPLY — 52 items
BLADE SURG 15 STRL LF DISP TIS (BLADE) IMPLANT
BLADE SURG 15 STRL SS (BLADE) ×3
BRIEF STRETCH FOR OB PAD LRG (UNDERPADS AND DIAPERS) ×2 IMPLANT
CANISTER SUCTION 2500CC (MISCELLANEOUS) ×3 IMPLANT
CLEANER TIP ELECTROSURG 2X2 (MISCELLANEOUS) IMPLANT
COVER MAYO STAND STRL (DRAPES) ×2 IMPLANT
COVER SURGICAL LIGHT HANDLE (MISCELLANEOUS) ×3 IMPLANT
DRAIN PENROSE 1/2X12 LTX STRL (WOUND CARE) ×2 IMPLANT
DRAIN PENROSE 1X12 LTX STRL (DRAIN) ×2 IMPLANT
DRAPE UTILITY XL STRL (DRAPES) ×2 IMPLANT
DRSG PAD ABDOMINAL 8X10 ST (GAUZE/BANDAGES/DRESSINGS) ×3 IMPLANT
ELECT CAUTERY BLADE 6.4 (BLADE) ×2 IMPLANT
ELECT REM PT RETURN 9FT ADLT (ELECTROSURGICAL) ×3
ELECTRODE REM PT RTRN 9FT ADLT (ELECTROSURGICAL) IMPLANT
GAUZE SPONGE 4X4 12PLY STRL (GAUZE/BANDAGES/DRESSINGS) ×3 IMPLANT
GAUZE SPONGE 4X4 16PLY XRAY LF (GAUZE/BANDAGES/DRESSINGS) ×3 IMPLANT
GLOVE BIO SURGEON STRL SZ 6 (GLOVE) ×3 IMPLANT
GLOVE BIO SURGEON STRL SZ7.5 (GLOVE) ×2 IMPLANT
GLOVE BIOGEL PI IND STRL 6.5 (GLOVE) ×1 IMPLANT
GLOVE BIOGEL PI IND STRL 7.5 (GLOVE) IMPLANT
GLOVE BIOGEL PI INDICATOR 6.5 (GLOVE) ×2
GLOVE BIOGEL PI INDICATOR 7.5 (GLOVE) ×2
GOWN STRL REUS W/ TWL LRG LVL3 (GOWN DISPOSABLE) ×1 IMPLANT
GOWN STRL REUS W/TWL 2XL LVL3 (GOWN DISPOSABLE) ×4 IMPLANT
GOWN STRL REUS W/TWL LRG LVL3 (GOWN DISPOSABLE) ×9
HANDPIECE INTERPULSE COAX TIP (DISPOSABLE) ×3
KIT BASIN OR (CUSTOM PROCEDURE TRAY) ×3 IMPLANT
KIT ROOM TURNOVER OR (KITS) ×3 IMPLANT
NDL 18GX1X1/2 (RX/OR ONLY) (NEEDLE) IMPLANT
NDL HYPO 25GX1X1/2 BEV (NEEDLE) IMPLANT
NEEDLE 18GX1X1/2 (RX/OR ONLY) (NEEDLE) IMPLANT
NEEDLE HYPO 25GX1X1/2 BEV (NEEDLE) ×3 IMPLANT
NS IRRIG 1000ML POUR BTL (IV SOLUTION) ×3 IMPLANT
PACK LITHOTOMY IV (CUSTOM PROCEDURE TRAY) ×3 IMPLANT
PAD ARMBOARD 7.5X6 YLW CONV (MISCELLANEOUS) ×4 IMPLANT
PENCIL BUTTON HOLSTER BLD 10FT (ELECTRODE) ×2 IMPLANT
SET HNDPC FAN SPRY TIP SCT (DISPOSABLE) IMPLANT
SPONGE LAP 18X18 X RAY DECT (DISPOSABLE) ×2 IMPLANT
SURGILUBE 2OZ TUBE FLIPTOP (MISCELLANEOUS) ×2 IMPLANT
SUT ETHILON 2 0 FS 18 (SUTURE) ×2 IMPLANT
SWAB COLLECTION DEVICE MRSA (MISCELLANEOUS) ×3 IMPLANT
SYR 20CC LL (SYRINGE) ×2 IMPLANT
SYR BULB 3OZ (MISCELLANEOUS) ×2 IMPLANT
SYR CONTROL 10ML LL (SYRINGE) ×2 IMPLANT
TOWEL OR 17X24 6PK STRL BLUE (TOWEL DISPOSABLE) ×1 IMPLANT
TOWEL OR 17X26 10 PK STRL BLUE (TOWEL DISPOSABLE) ×3 IMPLANT
TUBE ANAEROBIC SPECIMEN COL (MISCELLANEOUS) ×3 IMPLANT
TUBE CONNECTING 12'X1/4 (SUCTIONS) ×1
TUBE CONNECTING 12X1/4 (SUCTIONS) ×2 IMPLANT
UNDERPAD 30X30 INCONTINENT (UNDERPADS AND DIAPERS) ×3 IMPLANT
WATER STERILE IRR 1000ML POUR (IV SOLUTION) ×1 IMPLANT
YANKAUER SUCT BULB TIP NO VENT (SUCTIONS) ×3 IMPLANT

## 2015-08-15 NOTE — Progress Notes (Signed)
Pt returned to 6N07 from PACU via bed.  Pt AAO X 4.  Pt on RA.  Pt has 20G to Rt AC with fluids infusing. Pt has drain gauze and ABD to lt buttocks with mesh briefs.  Report rcvd from Dierdre SearlesLi, Charity fundraiserN. Will continue to monitor.

## 2015-08-15 NOTE — Interval H&P Note (Signed)
History and Physical Interval Note:  08/15/2015 10:54 AM  Chad Morrow  has presented today for surgery, with the diagnosis of PERIRECTAL ABSCESS  The various methods of treatment have been discussed with the patient and family. After consideration of risks, benefits and other options for treatment, the patient has consented to  Procedure(s): IRRIGATION AND DEBRIDEMENT PERIRECTAL ABSCESS (N/A) as a surgical intervention .  The patient's history has been reviewed, patient examined, no change in status, stable for surgery.  I have reviewed the patient's chart and labs.  Questions were answered to the patient's satisfaction.     [name]

## 2015-08-15 NOTE — Anesthesia Preprocedure Evaluation (Addendum)
Anesthesia Evaluation  Patient identified by MRN, date of birth, ID band Patient awake    Reviewed: Allergy & Precautions, NPO status , Patient's Chart, lab work & pertinent test results  Airway Mallampati: II  TM Distance: >3 FB Neck ROM: Full    Dental  (+) Teeth Intact, Dental Advisory Given, Chipped,    Pulmonary neg pulmonary ROS,    Pulmonary exam normal breath sounds clear to auscultation       Cardiovascular Exercise Tolerance: Good negative cardio ROS Normal cardiovascular exam Rhythm:Regular Rate:Normal     Neuro/Psych negative neurological ROS  negative psych ROS   GI/Hepatic negative GI ROS, Neg liver ROS,   Endo/Other  Obesity   Renal/GU negative Renal ROS     Musculoskeletal negative musculoskeletal ROS (+)   Abdominal   Peds  Hematology negative hematology ROS (+)   Anesthesia Other Findings Day of surgery medications reviewed with the patient.  Reproductive/Obstetrics negative OB ROS                            Anesthesia Physical Anesthesia Plan  ASA: II  Anesthesia Plan: General   Post-op Pain Management:    Induction: Intravenous  Airway Management Planned: LMA  Additional Equipment:   Intra-op Plan:   Post-operative Plan: Extubation in OR  Informed Consent: I have reviewed the patients History and Physical, chart, labs and discussed the procedure including the risks, benefits and alternatives for the proposed anesthesia with the patient or authorized representative who has indicated his/her understanding and acceptance.   Dental advisory given  Plan Discussed with: CRNA  Anesthesia Plan Comments: (Risks/benefits of general anesthesia discussed with patient including risk of damage to teeth, lips, gum, and tongue, nausea/vomiting, allergic reactions to medications, and the possibility of heart attack, stroke and death.  All patient questions answered.   Patient wishes to proceed.)       Anesthesia Quick Evaluation

## 2015-08-15 NOTE — Anesthesia Postprocedure Evaluation (Signed)
Anesthesia Post Note  Patient: Chad MitchelRuthann Morrow  Procedure(s) Performed: Procedure(s) (LRB): IRRIGATION AND DEBRIDEMENT PERIRECTAL ABSCESS (N/A)  Patient location during evaluation: PACU Anesthesia Type: General Level of consciousness: awake and alert Pain management: pain level controlled Vital Signs Assessment: post-procedure vital signs reviewed and stable Respiratory status: spontaneous breathing, nonlabored ventilation, respiratory function stable and patient connected to nasal cannula oxygen Cardiovascular status: blood pressure returned to baseline and stable Postop Assessment: no signs of nausea or vomiting Anesthetic complications: no    Last Vitals:  Filed Vitals:   08/15/15 1322 08/15/15 1516  BP: 128/76 128/80  Pulse: 91 86  Temp: 36.8 C 37.2 C  Resp: 18 18    Last Pain:  Filed Vitals:   08/15/15 1517  PainSc: 8                  Cecile HearingStephen Edward Turk

## 2015-08-15 NOTE — Transfer of Care (Signed)
Immediate Anesthesia Transfer of Care Note  Patient: Chad Morrow  Procedure(s) Performed: Procedure(s): IRRIGATION AND DEBRIDEMENT PERIRECTAL ABSCESS (N/A)  Patient Location: PACU  Anesthesia Type:General  Level of Consciousness: awake, alert  and oriented  Airway & Oxygen Therapy: Patient connected to face mask oxygen  Post-op Assessment: Post -op Vital signs reviewed and stable  Post vital signs: stable  Last Vitals:  Filed Vitals:   08/15/15 1007 08/15/15 1230  BP: 128/73   Pulse: 82   Temp: 37.5 C 36.8 C  Resp: 18     Last Pain:  Filed Vitals:   08/15/15 1233  PainSc: 10-Worst pain ever         Complications: No apparent anesthesia complications

## 2015-08-15 NOTE — Op Note (Signed)
Exam under anesthesia, Incision and Drainage Complicated perirectal abscess (horseshoe)   Pre-operative Diagnosis: horseshoe perirectal abscess   Post-operative Diagnosis: same   Indications: rectal pain, pressure, CT with horseshoe abscess demonstrated easily  Anesthesia: General   Procedure Details  The procedure, risks and complications have been discussed in detail (including, infection, bleeding, need for additional procedures) with the patient, and the patient has signed consent to the procedure. The patient was informed that the wound would be left open.  The patient was taken to OR 1 and general anesthesia was induced. The patient was placed into lithotomy position.  The skin was sterilely prepped and draped over the affected area in the usual fashion. Time out was performed according to the surgical safety checklist.   A finder needle was used to locate the largest pocket of pus, which was on the left.  I&D with a #11 blade was performed on the left perirectal region.  Cultures were sent. Additional skin was debrided around the opening. Purulent drainage was present. The cavity went high and around the posterior rectal wall.  This then went anterior.  The pulse lavage was used to irrigate the cavity. A 3/4" penrose was placed into the left side and the right side via the left sided incision.  This was secured to the skin with 2-0 vicryl.  An anoscope was used to look for internal drainage and none was seen.  The patient was awakened from anesthesia and taken to the PACU in stable condition. Needle, sponge, and instrument counts were correct.    Findings:  Copious purulent drainage from the complex perirectal abscess  EBL: <25 cc's   Drains: None   Condition: Tolerated procedure well   Complications:  none known.

## 2015-08-15 NOTE — ED Notes (Signed)
Pt taken for CT via bed 

## 2015-08-15 NOTE — H&P (Signed)
Chad Morrow is an 45 y.o. male.   Chief Complaint: Perirectal "pressure"  HPI: This is a 45 yo male in good health who presents about 3 weeks after some type of steroid injection in his lumbar spine.  He states that since the injection, he has felt constipated.  Over the last week, he has felt more pressure around his rectum and began using laxatives.  Since Wednesday, the pressure has become more severe.  He used an enema this morning, which caused a bowel movement but no significant relief in his symptoms.  He came to the ED for evaluation.  On examination initially, he was found to have an empty rectal vault with no external signs of abscess.  He underwent CT pelvis which showed a large complex perirectal abscess extending up both sides of the rectum.  History reviewed. No pertinent past medical history.  History reviewed. No pertinent past surgical history.  History reviewed. No pertinent family history. Social History:  reports that he has never smoked. He does not have any smokeless tobacco history on file. He reports that he drinks about 3.6 oz of alcohol per week. He reports that he does not use illicit drugs.  Allergies: No Known Allergies  Prior to Admission medications   Medication Sig Start Date End Date Taking? Authorizing Provider  bisacodyl (BISACODYL) 5 MG EC tablet Take 5 mg by mouth daily as needed for moderate constipation.   Yes Historical Provider, MD  cyclobenzaprine (FLEXERIL) 5 MG tablet Take 1 tablet (5 mg total) by mouth 3 (three) times daily as needed for muscle spasms. 04/29/15  Yes Dorna Leitz, PA-C  magnesium citrate SOLN Take 1 Bottle by mouth once.   Yes Historical Provider, MD  naproxen sodium (ANAPROX) 220 MG tablet Take 440 mg by mouth daily as needed (for pain).   Yes Historical Provider, MD  PEDIALYTE (PEDIALYTE) SOLN Take 240 mLs by mouth daily as needed (for electrolyte replinishment).   Yes Historical Provider, MD  psyllium (METAMUCIL) 58.6 % powder  Take 1 packet by mouth daily as needed (for constipation).   Yes Historical Provider, MD  Sodium Phosphates ([employer] ENEMA DISPOSABLE RE) Place 1 each rectally daily as needed (for constipation).   Yes Historical Provider, MD     Results for orders placed or performed during the hospital encounter of 08/14/15 (from the past 48 hour(s))  CBC     Status: Abnormal   Collection Time: 08/14/15 11:34 PM  Result Value Ref Range   WBC 10.8 (H) 4.0 - 10.5 K/uL   RBC 5.42 4.22 - 5.81 MIL/uL   Hemoglobin 16.1 13.0 - 17.0 g/dL   HCT 16.1 09.6 - 04.5 %   MCV 85.8 78.0 - 100.0 fL   MCH 29.7 26.0 - 34.0 pg   MCHC 34.6 30.0 - 36.0 g/dL   RDW 40.9 81.1 - 91.4 %   Platelets 246 150 - 400 K/uL  I-stat chem 8, ed     Status: Abnormal   Collection Time: 08/14/15 11:48 PM  Result Value Ref Range   Sodium 133 (L) 135 - 145 mmol/L   Potassium 3.8 3.5 - 5.1 mmol/L   Chloride 93 (L) 101 - 111 mmol/L   BUN 7 6 - 20 mg/dL   Creatinine, Ser 7.82 0.61 - 1.24 mg/dL   Glucose, Bld 956 (H) 65 - 99 mg/dL   Calcium, Ion 2.13 (L) 1.12 - 1.23 mmol/L   TCO2 28 0 - 100 mmol/L   Hemoglobin 17.3 (H) 13.0 - 17.0  g/dL   HCT 82.951.0 56.239.0 - 13.052.0 %   Ct Pelvis W Contrast  08/15/2015  CLINICAL DATA:  Acute onset of rectal pain. Recent cortisone shot at the lower back. Initial encounter. EXAM: CT PELVIS WITH CONTRAST TECHNIQUE: Multidetector CT imaging of the pelvis was performed using the standard protocol following the bolus administration of intravenous contrast. CONTRAST:  100mL ISOVUE-300 IOPAMIDOL (ISOVUE-300) INJECTION 61% COMPARISON:  None. FINDINGS: A complex peripherally enhancing abscess is noted extending about both sides of the the anorectal canal, measuring 7.2 cm on the right and 5.6 cm on the left, with surrounding soft tissue inflammation. There is extension to the edge of the anorectal canal posteriorly. The visualized small and large bowel are grossly unremarkable. The bladder is mildly distended and grossly  unremarkable in appearance. The prostate remains normal in size. No inguinal lymphadenopathy is seen. No acute osseous abnormalities are identified. IMPRESSION: Complex peripherally enhancing abscess noted extending about the both sides of the anorectal canal, measuring 7.2 cm in length on the right and 5.6 cm in length on the left, with surrounding soft tissue inflammation. This extends to the edge of the anorectal canal posteriorly. Electronically Signed   By: Roanna RaiderJeffery  Chang M.D.   On: 08/15/2015 03:24    Review of Systems  Constitutional: Negative for weight loss.  HENT: Negative for ear discharge, ear pain, hearing loss and tinnitus.   Eyes: Negative for blurred vision, double vision, photophobia and pain.  Respiratory: Negative for cough, sputum production and shortness of breath.   Cardiovascular: Negative for chest pain.  Gastrointestinal: Positive for constipation. Negative for nausea, vomiting and abdominal pain.  Genitourinary: Negative for dysuria, urgency, frequency and flank pain.  Musculoskeletal: Positive for back pain. Negative for myalgias, joint pain, falls and neck pain.  Neurological: Negative for dizziness, tingling, sensory change, focal weakness, loss of consciousness and headaches.  Endo/Heme/Allergies: Does not bruise/bleed easily.  Psychiatric/Behavioral: Negative for depression, memory loss and substance abuse. The patient is not nervous/anxious.     Blood pressure 119/80, pulse 79, temperature 98.5 F (36.9 C), temperature source Oral, resp. rate 16, SpO2 99 %. Physical Exam  WDWN in NAD HEENT:  EOMI, sclera anicteric Neck:  No masses, no thyromegaly Lungs:  CTA bilaterally; normal respiratory effort CV:  Regular rate and rhythm; no murmurs Abd:  +bowel sounds, soft, non-tender, no masses Rectum:  No erythema or induration around rectum.  On digital rectal examination, slight fullness and tenderness posteriorly. Ext:  Well-perfused; no edema Skin:  Warm, dry;  no sign of jaundice  Assessment/Plan Perirectal abscess - this presentation is a bit unusual since the patient is remarkably healthy with no significant comorbidities.  The significance of the recent back injection is unclear.  There are minimal signs of abscess externally, which may indicate that the abscess extends higher.  Will admit the patient to the hospital for IV antibiotics. Will proceed to OR later this morning for incision and drainage of perirectal abscess under anesthesia.  This will be performed by Dr. Donell BeersByerly, who is our emergency general surgeon this week.  I have discussed the procedure with the patient.  Wynona LunaSUEI,Daeshaun Specht K., MD 08/15/2015, 4:59 AM

## 2015-08-16 ENCOUNTER — Encounter (HOSPITAL_COMMUNITY): Payer: Self-pay | Admitting: General Surgery

## 2015-08-16 MED ORDER — SODIUM CHLORIDE 0.9% FLUSH
3.0000 mL | INTRAVENOUS | Status: DC | PRN
  Administered 2015-08-16: 3 mL via INTRAVENOUS
  Filled 2015-08-16: qty 3

## 2015-08-16 MED ORDER — SODIUM CHLORIDE 0.9% FLUSH: 3.0000 mL | Freq: Two times a day (BID) | INTRAVENOUS | Status: DC

## 2015-08-16 NOTE — Care Management Note (Addendum)
Case Management Note  Patient Details  Name: Chad Morrow MRN: [id number] Date of Birth: [birth year]  Subjective/Objective:                    Action/Plan: Provided MATCH letter dated 08-17-15 to 08-24-15 with exception for 14 days of Oxycodone IR. Patient voiced understanding.  Discussed home health , patient states he has assistance at home for dressing changes .  Confirmed face sheet information.  Patient does not have PCP , [facility], [facility] at 0900 am . Patient aware and has phone number and address.  Expected Discharge Date:                  Expected Discharge Plan:  Home w Home Health Services  In-House Referral:     Discharge planning Services  CM Consult[facility]  Post Acute Care Choice:  Home Health Choice offered to:  Patient  DME Arranged:    DME Agency:     HH Arranged:  RN HH Agency:  [facility]  Status of Service:  Completed, signed off  If discussed at Long Length of Stay Meetings, dates discussed:    Additional Comments:  Kingsley PlanWile, Reeta Kuk Marie, RN 08/16/2015, 3:46 PM

## 2015-08-16 NOTE — Progress Notes (Signed)
Patient ID: Chad Morrow, male   DOB: 26-Oct-1970, 45 y.o.   MRN: [id number]     [facility]      [address] [place], [place] [address]    Phone: [phone] FAX: [phone]     Subjective: Temp 101.1. Sore, but feels much better. Ambulating. Voiding. Tolerating POs.   Objective:  Vital signs:  Filed Vitals:   08/15/15 1843 08/15/15 2054 08/16/15 0217 08/16/15 0615  BP: 130/71 129/73 132/70 121/74  Pulse: 115 111 93 86  Temp: 101.1 F (38.4 C) 99.7 F (37.6 C) 98.9 F (37.2 C) 98.7 F (37.1 C)  TempSrc: Oral Oral Oral Oral  Resp: '18 17 18 18  ' Height:      Weight:      SpO2: 100% 99% 99% 100%    Last BM Date: 08/15/15  Intake/Output   Yesterday:  06/19 0701 - 06/20 0700 In: 2032 [P.O.:582; I.V.:1450] Out: 775 [Urine:775] This shift:  Total I/O In: 240 [P.O.:240] Out: 200 [Urine:200]   Physical Exam: General: Pt awake/alert/oriented x4 in no acute distress Skin: penrose drain in place, purulent bloody drainage.  Surrounding skin is soft.    Problem List:   Active Problems:   Perirectal abscess    Results:   Labs: Results for orders placed or performed during the hospital encounter of 08/14/15 (from the past 48 hour(s))  CBC     Status: Abnormal   Collection Time: 08/14/15 11:34 PM  Result Value Ref Range   WBC 10.8 (H) 4.0 - 10.5 K/uL   RBC 5.42 4.22 - 5.81 MIL/uL   Hemoglobin 16.1 13.0 - 17.0 g/dL   HCT 46.5 39.0 - 52.0 %   MCV 85.8 78.0 - 100.0 fL   MCH 29.7 26.0 - 34.0 pg   MCHC 34.6 30.0 - 36.0 g/dL   RDW 12.8 11.5 - 15.5 %   Platelets 246 150 - 400 K/uL  I-stat chem 8, ed     Status: Abnormal   Collection Time: 08/14/15 11:48 PM  Result Value Ref Range   Sodium 133 (L) 135 - 145 mmol/L   Potassium 3.8 3.5 - 5.1 mmol/L   Chloride 93 (L) 101 - 111 mmol/L   BUN 7 6 - 20 mg/dL   Creatinine, Ser 1.10 0.61 - 1.24 mg/dL   Glucose, Bld 106 (H) 65 - 99 mg/dL   Calcium, Ion 1.01 (L) 1.12 -  1.23 mmol/L   TCO2 28 0 - 100 mmol/L   Hemoglobin 17.3 (H) 13.0 - 17.0 g/dL   HCT 51.0 39.0 - 52.0 %  CBC     Status: Abnormal   Collection Time: 08/15/15  6:55 AM  Result Value Ref Range   WBC 11.9 (H) 4.0 - 10.5 K/uL   RBC 5.37 4.22 - 5.81 MIL/uL   Hemoglobin 16.0 13.0 - 17.0 g/dL   HCT 46.5 39.0 - 52.0 %   MCV 86.6 78.0 - 100.0 fL   MCH 29.8 26.0 - 34.0 pg   MCHC 34.4 30.0 - 36.0 g/dL   RDW 13.0 11.5 - 15.5 %   Platelets 250 150 - 400 K/uL  Creatinine, serum     Status: Abnormal   Collection Time: 08/15/15  6:55 AM  Result Value Ref Range   Creatinine, Ser 1.25 (H) 0.61 - 1.24 mg/dL   GFR calc non Af Amer >60 >60 mL/min   GFR calc Af Amer >60 >60 mL/min    Comment: (NOTE) The eGFR has been calculated using  the CKD EPI equation. This calculation has not been validated in all clinical situations. eGFR's persistently <60 mL/min signify possible Chronic Kidney Disease.   Surgical pcr screen     Status: None   Collection Time: 08/15/15 10:30 AM  Result Value Ref Range   MRSA, PCR NEGATIVE NEGATIVE   Staphylococcus aureus NEGATIVE NEGATIVE    Comment:        The Xpert SA Assay (FDA approved for NASAL specimens in patients over 3 years of age), is one component of a comprehensive surveillance program.  Test performance has been validated by [facility] for patients greater than or equal to 75 year old. It is not intended to diagnose infection nor to guide or monitor treatment.   Aerobic/Anaerobic Culture (surgical/deep wound)     Status: None (Preliminary result)   Collection Time: 08/15/15 11:56 AM  Result Value Ref Range   Specimen Description ABSCESS PERIRECTAL    Special Requests PATIENT ON FOLLOWING  ZOSYN    Gram Stain      ABUNDANT WBC PRESENT, PREDOMINANTLY PMN ABUNDANT GRAM NEGATIVE RODS ABUNDANT GRAM POSITIVE COCCI IN PAIRS GRAM POSITIVE COCCI IN CLUSTERS    Culture PENDING    Report Status PENDING   Glucose, capillary     Status: Abnormal    Collection Time: 08/15/15  8:52 PM  Result Value Ref Range   Glucose-Capillary 161 (H) 65 - 99 mg/dL    Imaging / Studies: Ct Pelvis W Contrast  08/15/2015  CLINICAL DATA:  Acute onset of rectal pain. Recent cortisone shot at the lower back. Initial encounter. EXAM: CT PELVIS WITH CONTRAST TECHNIQUE: Multidetector CT imaging of the pelvis was performed using the standard protocol following the bolus administration of intravenous contrast. CONTRAST:  119m ISOVUE-300 IOPAMIDOL (ISOVUE-300) INJECTION 61% COMPARISON:  None. FINDINGS: A complex peripherally enhancing abscess is noted extending about both sides of the the anorectal canal, measuring 7.2 cm on the right and 5.6 cm on the left, with surrounding soft tissue inflammation. There is extension to the edge of the anorectal canal posteriorly. The visualized small and large bowel are grossly unremarkable. The bladder is mildly distended and grossly unremarkable in appearance. The prostate remains normal in size. No inguinal lymphadenopathy is seen. No acute osseous abnormalities are identified. IMPRESSION: Complex peripherally enhancing abscess noted extending about the both sides of the anorectal canal, measuring 7.2 cm in length on the right and 5.6 cm in length on the left, with surrounding soft tissue inflammation. This extends to the edge of the anorectal canal posteriorly. Electronically Signed   By: JGarald BaldingM.D.   On: 08/15/2015 03:24    Medications / Allergies:  Scheduled Meds: . enoxaparin (LOVENOX) injection  40 mg Subcutaneous Q24H  . piperacillin-tazobactam (ZOSYN)  IV  3.375 g Intravenous Q8H  . psyllium  1 packet Oral TID   Continuous Infusions: . 0.9 % NaCl with KCl 20 mEq / L 125 mL/hr at 08/15/15 1610  . lactated ringers 10 mL/hr at 08/15/15 1049   PRN Meds:.bisacodyl, cyclobenzaprine, diphenhydrAMINE **OR** diphenhydrAMINE, HYDROmorphone (DILAUDID) injection, naproxen sodium, ondansetron **OR** ondansetron (ZOFRAN) IV,  oxyCODONE  Antibiotics: Anti-infectives    Start     Dose/Rate Route Frequency Ordered Stop   08/15/15 1130  cefUROXime (ZINACEF) 1.5 g in dextrose 5 % 50 mL IVPB  Status:  Discontinued     1.5 g 100 mL/hr over 30 Minutes Intravenous To Surgery 08/15/15 1126 08/15/15 1329   08/15/15 1000  piperacillin-tazobactam (ZOSYN) IVPB 3.375 g  3.375 g 12.5 mL/hr over 240 Minutes Intravenous Every 8 hours 08/15/15 0547     08/15/15 0400  piperacillin-tazobactam (ZOSYN) IVPB 3.375 g     3.375 g 100 mL/hr over 30 Minutes Intravenous  Once 08/15/15 0348 08/15/15 0441        Assessment/Plan POD#1 I&D perirectal abscess---Dr. Barry Dienes  -continue penrose drain, add sitz baths, metamucil -continue IV antibiotics and follow cultures.  Had a temp 101 last evening, hold on on DC for another 24h. ID-GNR on cultures VTE prophylaxis-scd, lovenox  FEN-no issues, sliv Dispo-anticipate DC in AM  Erby Pian, [facility] Pager [phone](7A-4:30P) For consults and floor pages call [phone](7A-4:30P)  08/16/2015 10:06 AM

## 2015-08-16 NOTE — Progress Notes (Addendum)
Disregard previous note

## 2015-08-17 MED ORDER — AMOXICILLIN-POT CLAVULANATE 875-125 MG PO TABS: 1.0000 | ORAL_TABLET | Freq: Two times a day (BID) | ORAL | Status: AC

## 2015-08-17 MED ORDER — OXYCODONE-ACETAMINOPHEN 5-325 MG PO TABS
1.0000 | ORAL_TABLET | ORAL | Status: DC | PRN
Start: 2015-08-17 — End: 2020-07-26

## 2015-08-17 NOTE — Progress Notes (Signed)
Patient ID: Chad Morrow, male   DOB: Jul 19, 1970, 45 y.o.   MRN: [id number]   LOS: 2 days   POD#2  Subjective: Feeling better, ready to go home   Objective: Vital signs in last 24 hours: Temp:  [98.1 F (36.7 C)] 98.1 F (36.7 C) (06/21 0510) Pulse Rate:  [62-82] 62 (06/21 0510) Resp:  [18] 18 (06/21 0510) BP: (116-119)/(66-78) 118/78 mmHg (06/21 0510) SpO2:  [100 %] 100 % (06/21 0510) Last BM Date: 08/15/15   Physical Exam General appearance: alert and no distress Incision/Wound:Penrose in place with bloody drainage   Assessment/Plan: POD#2 I&D perirectal abscess---Dr. Donell BeersByerly  -continue penrose drain, add sitz baths, metamucil -culture still pending. Afebrile. ID-GNR on cultures VTE prophylaxis-scd, lovenox  FEN-no issues Dispo- D/C today on empiric abx    Freeman CaldronMichael J. Rollin Kotowski, PA-C Pager: [phone] 08/17/2015

## 2015-08-17 NOTE — Discharge Summary (Signed)
Physician Discharge Summary  Patient ID: Chad Morrow MRN: [id number] DOB/AGE: [birth year] 45 y.o.  Admit date: 08/14/2015 Discharge date: 08/17/2015  Discharge Diagnoses Patient Active Problem List   Diagnosis Date Noted  . Perirectal abscess 08/15/2015    Consultants None   Procedures 6/19 -- Exam under anesthesia, I&D of complicated perirectal abscess by Dr. Almond LintFaera Byerly   HPI: This is a 45 yo male in good health who presents about 3 weeks after some type of steroid injection in his lumbar spine. He states that since the injection, he has felt constipated. Over the last week, he has felt more pressure around his rectum and began using laxatives. Since Wednesday, the pressure has become more severe. He used an enema this morning, which caused a bowel movement but no significant relief in his symptoms. He came to the ED for evaluation. On examination initially, he was found to have an empty rectal vault with no external signs of abscess. He underwent CT pelvis which showed a large complex perirectal abscess extending up both sides of the rectum. He was admitted to the surgical service for I&D of said abscess.   Hospital Course: The patient underwent I&D with a penrose drain left in place. His pain was controlled on oral medications. Cultures were pending at the time of discharge and he was continued on broad-spectrum antibiotics. He was discharged in good condition.     Medication List    TAKE these medications        amoxicillin-clavulanate 875-125 MG tablet  Commonly known as:  AUGMENTIN  Take 1 tablet by mouth every 12 (twelve) hours.     bisacodyl 5 MG EC tablet  Generic drug:  bisacodyl  Take 5 mg by mouth daily as needed for moderate constipation.     [employer] ENEMA DISPOSABLE RE  Place 1 each rectally daily as needed (for constipation).     cyclobenzaprine 5 MG tablet  Commonly known as:  FLEXERIL  Take 1 tablet (5 mg total) by mouth 3 (three) times daily as  needed for muscle spasms.     magnesium citrate Soln  Take 1 Bottle by mouth once.     naproxen sodium 220 MG tablet  Commonly known as:  ANAPROX  Take 440 mg by mouth daily as needed (for pain).     oxyCODONE-acetaminophen 5-325 MG tablet  Commonly known as:  ROXICET  Take 1-2 tablets by mouth every 4 (four) hours as needed (Pain).     PEDIALYTE Soln  Take 240 mLs by mouth daily as needed (for electrolyte replinishment).     psyllium 58.6 % powder  Commonly known as:  METAMUCIL  Take 1 packet by mouth daily as needed (for constipation).         Follow-up Information    Follow up with [facility].   Specialty:  Internal Medicine   Why:  September 22, 2015 at 0900 am    Contact information:   [address] [place] [id number] [phone]      Follow up with Almond LintBYERLY,FAERA, MD.   Specialty:  General Surgery   Why:  The office will call you with the appointment   Contact information:   [address] [place] [address] [phone]        Signed: Freeman CaldronMichael J. Zenaida Tesar, PA-C Pager: [phone] 08/17/2015, 8:28 AM

## 2015-08-17 NOTE — Progress Notes (Signed)
Pt discharged to home.  Discharge instructions explained to pt.  Pt has no questions at time of discharge.  Pt has earring in chart that was returned to pt.  Pt states he has all other belongings.  IV removed.  Pt ambulated off unit on his own.

## 2015-08-17 NOTE — Discharge Instructions (Signed)
Perirectal Abscess An abscess is an infected area that contains a collection of pus. A perirectal abscess is an abscess that is near the opening of the anus or around the rectum. A perirectal abscess can cause a lot of pain, especially during bowel movements. CAUSES This condition is almost always caused by an infection that starts in an anal gland. RISK FACTORS This condition is more likely to develop in:  People with diabetes or inflammatory bowel disease.  People whose body defense system (immune system) is weak.  People who have anal sex.  People who have a sexually transmitted disease (STD).  People who have certain kinds of cancers, such as rectal carcinoma, leukemia, or lymphoma. SYMPTOMS The main symptom of this condition is pain. The pain may be a throbbing pain that gets worse during bowel movements. Other symptoms include:  Fever.  Swelling.  Redness.  Bleeding.  Constipation. DIAGNOSIS The condition is diagnosed with a physical exam. If the abscess is not visible, a health care provider may need to place a finger inside the rectum to find the abscess. Sometimes, imaging tests are done to determine the size and location of the abscess. These tests may include:  An ultrasound.  An MRI.  A CT scan. TREATMENT This condition is usually treated with incision and drainage surgery. Incision and drainage surgery involves making an incision over the abscess to drain the pus. Treatment may also involve antibiotic medicine, pain medicine, stool softeners, or laxatives. HOME CARE INSTRUCTIONS  Take medicines only as directed by your health care provider.  If you were prescribed an antibiotic, finish all of it even if you start to feel better.  To relieve pain, try sitting:  In a warm, shallow bath (sitz bath).  On a heating pad with the setting on low.  On an inflatable donut-shaped cushion.  Follow any diet instructions as directed by your health care  provider.  Keep all follow-up visits as directed by your health care provider. This is important. SEEK MEDICAL CARE IF:  Your abscess is bleeding.  You have pain, swelling, or redness that is getting worse.  You are constipated.  You feel ill.  You have muscle aches or chills.  You have a fever.  Your symptoms return after the abscess has healed.   This information is not intended to replace advice given to you by your health care provider. Make sure you discuss any questions you have with your health care provider.   Document Released: 02/10/2000 Document Revised: 11/03/2014 Document Reviewed: 12/23/2013 Elsevier Interactive Patient Education 2016 Elsevier Inc.  

## 2015-08-20 LAB — AEROBIC/ANAEROBIC CULTURE (SURGICAL/DEEP WOUND)

## 2015-08-23 ENCOUNTER — Ambulatory Visit (INDEPENDENT_AMBULATORY_CARE_PROVIDER_SITE_OTHER): Payer: Self-pay | Admitting: Urgent Care

## 2015-08-23 ENCOUNTER — Ambulatory Visit: Payer: Worker's Compensation

## 2015-08-23 VITALS — BP 132/86 | HR 73 | Temp 98.3°F | Resp 18 | Ht 69.25 in | Wt 201.0 lb

## 2015-08-23 DIAGNOSIS — K611 Rectal abscess: Secondary | ICD-10-CM

## 2015-08-23 DIAGNOSIS — R0602 Shortness of breath: Secondary | ICD-10-CM

## 2015-08-23 DIAGNOSIS — Z9889 Other specified postprocedural states: Secondary | ICD-10-CM

## 2015-08-23 LAB — POCT CBC
Granulocyte percent: 62.6 %G (ref 37–80)
HEMATOCRIT: 43.3 % — AB (ref 43.5–53.7)
Hemoglobin: 15.1 g/dL (ref 14.1–18.1)
LYMPH, POC: 2.5 (ref 0.6–3.4)
MCH, POC: 30.4 pg (ref 27–31.2)
MCHC: 35 g/dL (ref 31.8–35.4)
MCV: 86.8 fL (ref 80–97)
MID (CBC): 0.3 (ref 0–0.9)
MPV: 6.8 fL (ref 0–99.8)
POC GRANULOCYTE: 4.6 (ref 2–6.9)
POC LYMPH %: 33.2 % (ref 10–50)
POC MID %: 4.2 %M (ref 0–12)
Platelet Count, POC: 460 10*3/uL — AB (ref 142–424)
RBC: 4.99 M/uL (ref 4.69–6.13)
RDW, POC: 13.8 %
WBC: 7.4 10*3/uL (ref 4.6–10.2)

## 2015-08-23 MED ORDER — FAMOTIDINE 20 MG PO TABS: 20.0000 mg | ORAL_TABLET | Freq: Two times a day (BID) | ORAL | Status: DC

## 2015-08-23 NOTE — Progress Notes (Signed)
MRN: [id number] DOB: 03/16/1970  Subjective:   Chad Morrow is a 45 y.o. male presenting for chief complaint of Shortness of Breath  Reports 2 day history of shortness of breath at night, worse with lying down. Patient had a home nurse visit today, she was concerned that he had a blood clot. Patient had I&D ~1 week ago, was hospitalized between 06/18-21/2017, is currently taking Augmentin. Denies chest pain, heart racing, fever, cough, hemoptysis.   Chad Morrow has a current medication list which includes the following prescription(s): amoxicillin-clavulanate, bisacodyl, cyclobenzaprine, magnesium citrate, naproxen sodium, oxycodone-acetaminophen, pedialyte, psyllium, and sodium phosphates. Also has No Known Allergies.  Chad Morrow  has a past medical history of Arthritis and Chronic lower back pain. Also  has past surgical history that includes Incision and drainage perirectal abscess (08/15/2015) and Incision and drainage perirectal abscess (N/A, 08/15/2015).  Objective:   Vitals: BP 132/86 mmHg  Pulse 73  Temp(Src) 98.3 F (36.8 C) (Oral)  Resp 18  Ht 5' 9.25" (1.759 m)  Wt 201 lb (91.173 kg)  BMI 29.47 kg/m2  SpO2 100%  Physical Exam  Constitutional: He is oriented to person, place, and time. He appears well-developed and well-nourished.  HENT:  Mouth/Throat: Oropharynx is clear and moist.  Eyes: No scleral icterus.  Cardiovascular: Normal rate, regular rhythm and intact distal pulses.  Exam reveals no gallop and no friction rub.   No murmur heard. Pulmonary/Chest: No respiratory distress. He has no wheezes. He has no rales.  Neurological: He is alert and oriented to person, place, and time.  Skin: Skin is warm and dry.   Dg Chest 2 View  08/23/2015  CLINICAL DATA:  Shortness of breath. EXAM: CHEST  2 VIEW COMPARISON:  None. FINDINGS: The heart size and mediastinal contours are within normal limits. Both lungs are clear. The visualized skeletal structures are unremarkable.  IMPRESSION: No active cardiopulmonary disease. Electronically Signed   By: Gerome Samavid  Williams III M.D   On: 08/23/2015 18:17   Results for orders placed or performed in visit on 08/23/15 (from the past 24 hour(s))  POCT CBC     Status: Abnormal   Collection Time: 08/23/15  6:10 PM  Result Value Ref Range   WBC 7.4 4.6 - 10.2 K/uL   Lymph, poc 2.5 0.6 - 3.4   POC LYMPH PERCENT 33.2 10 - 50 %L   MID (cbc) 0.3 0 - 0.9   POC MID % 4.2 0 - 12 %M   POC Granulocyte 4.6 2 - 6.9   Granulocyte percent 62.6 37 - 80 %G   RBC 4.99 4.69 - 6.13 M/uL   Hemoglobin 15.1 14.1 - 18.1 g/dL   HCT, POC 40.943.3 (A) 81.143.5 - 53.7 %   MCV 86.8 80 - 97 fL   MCH, POC 30.4 27 - 31.2 pg   MCHC 35.0 31.8 - 35.4 g/dL   RDW, POC 91.413.8 %   Platelet Count, POC 460 (A) 142 - 424 K/uL   MPV 6.8 0 - 99.8 fL   ECG interpretation - normal sinus rhythm.  Assessment and Plan :   1. Shortness of breath 2. Recent major surgery 3. Perirectal abscess - I counseled patient on diagnosis of pulmonary embolism. I do not suspect that this is the case for the patient. I offered to do a chest CT given that it is a test of choice for PE but after discussion of patient's results, symptoms, patient declined this. He is aware of signs and symptoms and will rtc immediately if  these develop.  Wallis BambergMario Ryka Beighley, PA-C [facility] [phone] 08/23/2015 5:43 PM

## 2015-08-23 NOTE — Patient Instructions (Addendum)
     IF you received an x-ray today, you will receive an invoice from [facility]. Please contact [facility] at [phone] with questions or concerns regarding your invoice.   IF you received labwork today, you will receive an invoice from [facility]. Please contact [facility] at [phone] with questions or concerns regarding your invoice.   Our billing staff will not be able to assist you with questions regarding bills from these companies.  You will be contacted with the lab results as soon as they are available. The fastest way to get your results is to activate your My Chart account. Instructions are located on the last page of this paperwork. If you have not heard from us regarding the results in 2 weeks, please contact this office.      

## 2015-09-22 ENCOUNTER — Ambulatory Visit (INDEPENDENT_AMBULATORY_CARE_PROVIDER_SITE_OTHER): Payer: Self-pay | Admitting: Family Medicine

## 2015-09-22 ENCOUNTER — Encounter: Payer: Self-pay | Admitting: Family Medicine

## 2015-09-22 VITALS — BP 137/88 | HR 64 | Temp 98.4°F | Resp 14 | Ht 69.0 in | Wt 208.0 lb

## 2015-09-22 DIAGNOSIS — Z7689 Persons encountering health services in other specified circumstances: Secondary | ICD-10-CM

## 2015-09-22 DIAGNOSIS — Z7189 Other specified counseling: Secondary | ICD-10-CM
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
## 2015-09-22 NOTE — Patient Instructions (Signed)
Continue healthly lifesytle. Follow-up in 6 months when you get your insurance Follow-up otherwise if problems arise.

## 2016-03-22 ENCOUNTER — Ambulatory Visit: Payer: Self-pay | Admitting: Family Medicine

## 2016-05-31 ENCOUNTER — Telehealth: Payer: Self-pay | Admitting: Family Medicine

## 2016-05-31 NOTE — Telephone Encounter (Signed)
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

## 2016-12-19 ENCOUNTER — Encounter: Payer: Self-pay | Admitting: Physician Assistant

## 2016-12-19 ENCOUNTER — Ambulatory Visit (INDEPENDENT_AMBULATORY_CARE_PROVIDER_SITE_OTHER): Payer: Worker's Compensation | Admitting: Physician Assistant

## 2016-12-19 VITALS — BP 130/88 | HR 65 | Temp 98.3°F | Resp 16 | Ht 69.0 in | Wt 199.6 lb

## 2016-12-19 DIAGNOSIS — M79645 Pain in left finger(s): Secondary | ICD-10-CM | POA: Diagnosis not present

## 2016-12-19 MED ORDER — AMOXICILLIN-POT CLAVULANATE 875-125 MG PO TABS: 1.0000 | ORAL_TABLET | Freq: Two times a day (BID) | ORAL | 0 refills | Status: DC

## 2016-12-19 NOTE — Patient Instructions (Addendum)
  Keep the finger elevated as much as possible.  Use a warm compress as much as possible.  Take the antibiotics as prescribed.  Come back if your symptoms are getting worse.    IF you received an x-ray today, you will receive an invoice from [facility]. Please contact [facility] at [phone] with questions or concerns regarding your invoice.   IF you received labwork today, you will receive an invoice from [facility]. Please contact [facility] at [phone] with questions or concerns regarding your invoice.   Our billing staff will not be able to assist you with questions regarding bills from these companies.  You will be contacted with the lab results as soon as they are available. The fastest way to get your results is to activate your My Chart account. Instructions are located on the last page of this paperwork. If you have not heard from us regarding the results in 2 weeks, please contact this office.

## 2016-12-19 NOTE — Progress Notes (Signed)
    12/19/2016 11:59 AM   DOB: 11-May-1970 / MRN: [id number]  SUBJECTIVE:  Chad Morrow is a 46 y.o. male presenting for worsening distal left middle finger pain that started after being bit by a dog at work.  He washed the wound immediately.  Has not tried anything for the pain.  Says it throbs.  No weakness or dysfunction.   He has No Known Allergies.   He  has a past medical history of Arthritis and Chronic lower back pain.    He  reports that he quit smoking about 22 months ago. His smoking use included Cigarettes. He has a 1.56 pack-year smoking history. He has never used smokeless tobacco. He reports that he drinks about 3.6 oz of alcohol per week . He reports that he does not use drugs. He  reports that he currently engages in sexual activity. The patient  has a past surgical history that includes Incision and drainage perirectal abscess (08/15/2015) and Incision and drainage perirectal abscess (N/A, 08/15/2015).  His family history is not on file.  Review of Systems  Constitutional: Negative for chills, diaphoresis and fever.  Respiratory: Negative for shortness of breath.   Cardiovascular: Negative for chest pain, orthopnea and leg swelling.  Gastrointestinal: Negative for nausea.  Skin: Negative for rash.  Neurological: Negative for dizziness.    The problem list and medications were reviewed and updated by myself where necessary and exist elsewhere in the encounter.   OBJECTIVE:  BP 130/88 (BP Location: Right Arm, Patient Position: Sitting, Cuff Size: Large)   Pulse 65   Temp 98.3 F (36.8 C) (Oral)   Resp 16   Ht 5\' 9"  (1.753 m)   Wt 199 lb 9.6 oz (90.5 kg)   SpO2 100%   BMI 29.48 kg/m   Physical Exam  Constitutional: He appears well-developed. He is active and cooperative.  Non-toxic appearance.  Cardiovascular: Normal rate.   Pulmonary/Chest: Effort normal. No tachypnea.  Musculoskeletal: He exhibits tenderness (with moderate swelling about distal left finger  tip with questionable fluctuance).  Neurological: He is alert.  Skin: Skin is warm and dry. He is not diaphoretic. No pallor.  Vitals reviewed.   No results found for this or any previous visit (from the past 72 hour(s)).  No results found.  ASSESSMENT AND PLAN:  Chad Morrow was seen today for animal bite.  Diagnoses and all orders for this visit:  Pain of left middle finger -     amoxicillin-clavulanate (AUGMENTIN) 875-125 MG tablet; Take 1 tablet by mouth 2 (two) times daily.    The patient is advised to call or return to clinic if he does not see an improvement in symptoms, or to seek the care of the closest emergency department if he worsens with the above plan.   Deliah BostonMichael Capria Cartaya, MHS, PA-C Primary Care at [facility] 12/19/2016 11:59 AM

## 2018-01-08 IMAGING — CR DG LUMBAR SPINE COMPLETE 4+V
5 series · 5 of 5 positions shown · non-contrast
Comparison: None in PACs

CLINICAL DATA: Low back pain ; no mention of injury; initial visit.

EXAM:
LUMBAR SPINE - COMPLETE 4+ VIEW

[AP (1 of 2)]
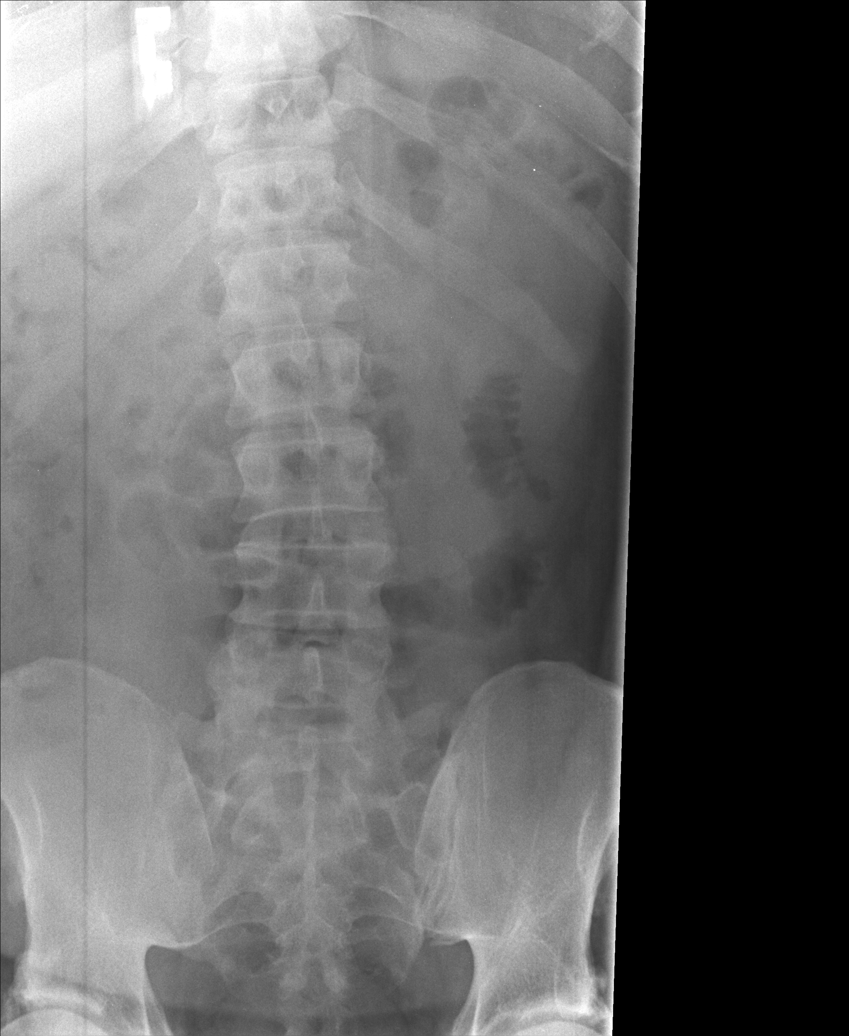

[AP (2 of 2)]
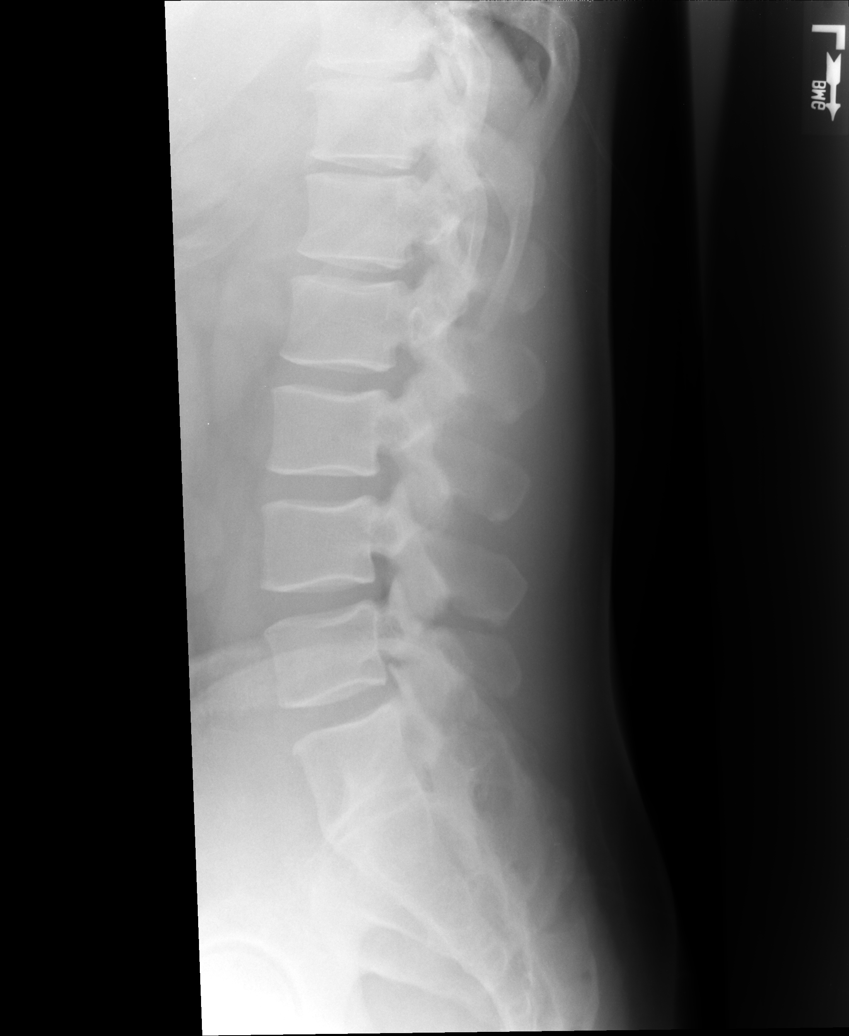

[l5 s1]
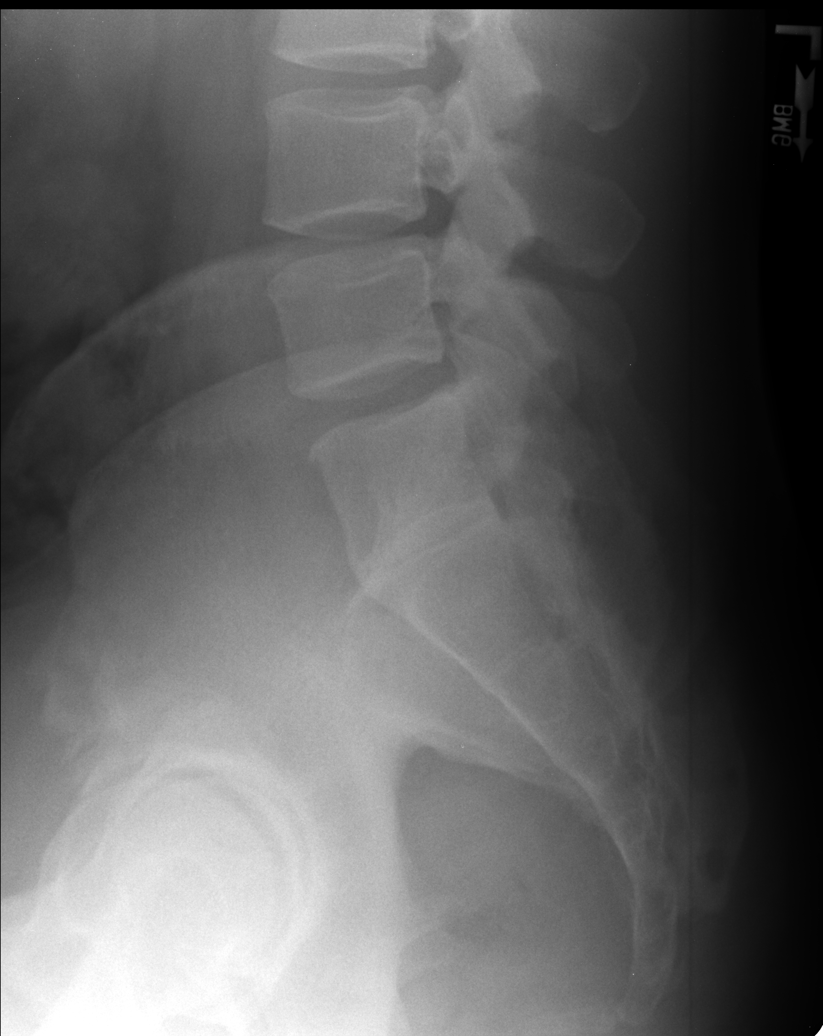

[rpo]
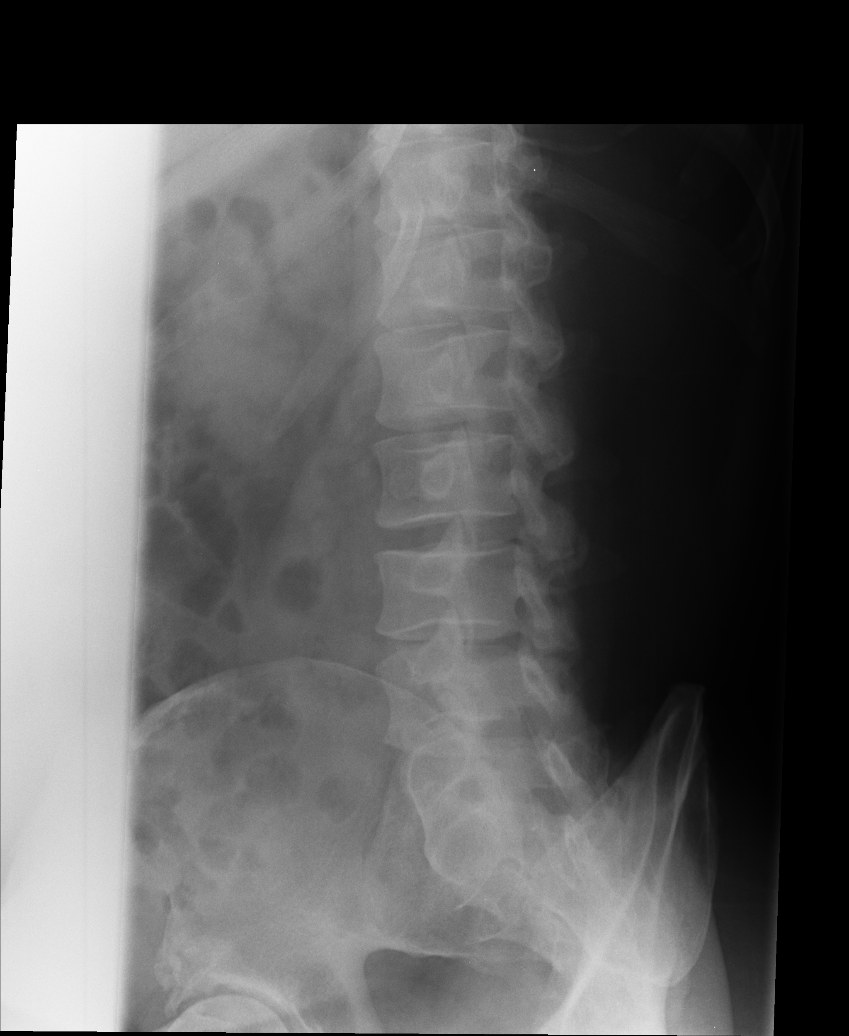

[lpo]
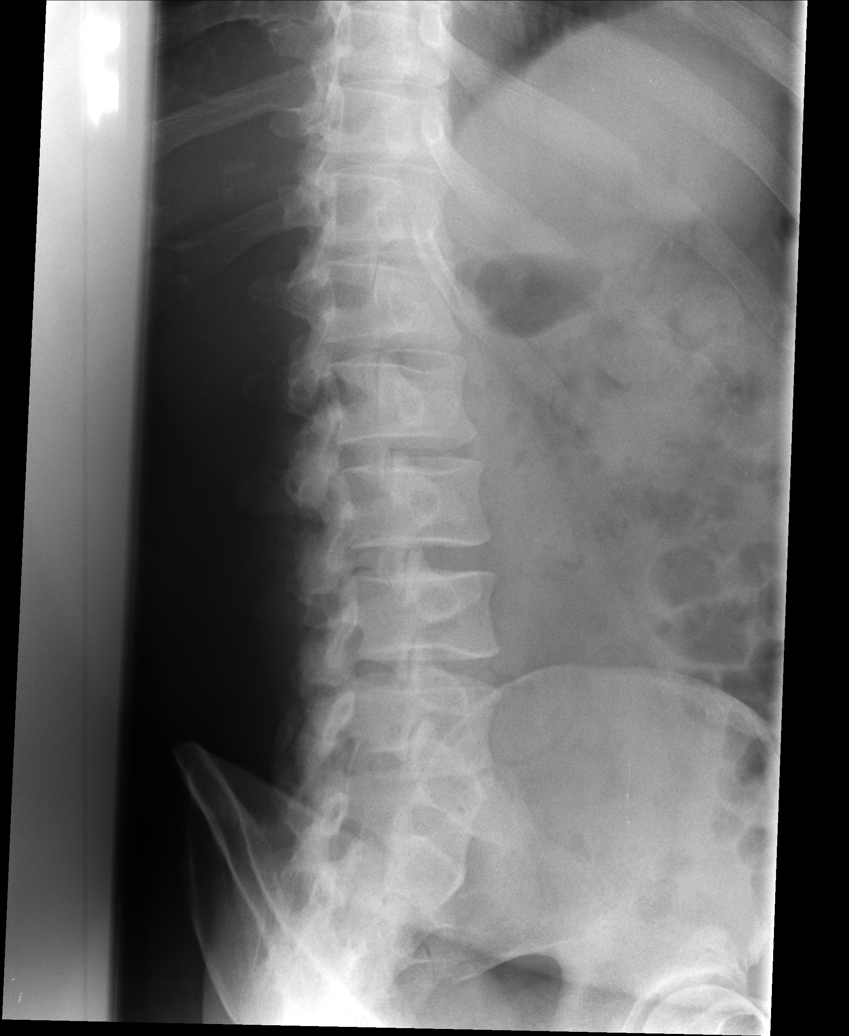

[5 of 5 positions shown; findings below may reference images not displayed]

FINDINGS: The lumbar vertebral bodies are preserved in height. S1 appears to
be transitional. The pedicles and transverse processes are intact.
There is gentle levocurvature centered at L3-4 which may be due to
muscle spasm. The disc space heights are reasonably well-maintained.
There is no spondylolisthesis. No significant facet joint
hypertrophy is observed.
IMPRESSION: There is no acute bony abnormality of the lumbar spine.

## 2020-07-26 ENCOUNTER — Emergency Department
Admission: EM | Admit: 2020-07-26 | Discharge: 2020-07-26 | Disposition: A | Discharge status: Home / Self Care | Payer: Self-pay | Attending: Emergency Medicine | Admitting: Emergency Medicine

## 2020-07-26 ENCOUNTER — Other Ambulatory Visit: Payer: Self-pay

## 2020-07-26 ENCOUNTER — Emergency Department: Payer: Self-pay

## 2020-07-26 DIAGNOSIS — S8001XA Contusion of right knee, initial encounter: Secondary | ICD-10-CM | POA: Insufficient documentation

## 2020-07-26 DIAGNOSIS — Z87891 Personal history of nicotine dependence: Secondary | ICD-10-CM | POA: Insufficient documentation

## 2020-07-26 DIAGNOSIS — Y92410 Unspecified street and highway as the place of occurrence of the external cause: Secondary | ICD-10-CM | POA: Insufficient documentation

## 2020-07-26 DIAGNOSIS — S39012A Strain of muscle, fascia and tendon of lower back, initial encounter: Secondary | ICD-10-CM | POA: Insufficient documentation

## 2020-07-26 MED ORDER — ORPHENADRINE CITRATE ER 100 MG PO TB12: 100.0000 mg | ORAL_TABLET | Freq: Two times a day (BID) | ORAL | 0 refills | Status: AC

## 2020-07-26 MED ORDER — OXYCODONE-ACETAMINOPHEN 7.5-325 MG PO TABS: 1.0000 | ORAL_TABLET | Freq: Four times a day (QID) | ORAL | 0 refills | Status: AC | PRN

## 2020-07-26 MED ORDER — LIDOCAINE 5 % EX PTCH
2.0000 | MEDICATED_PATCH | CUTANEOUS | Status: DC
  Administered 2020-07-26: 2 via TRANSDERMAL
  Filled 2020-07-26: qty 2

## 2020-07-26 MED ORDER — IBUPROFEN 800 MG PO TABS: 800.0000 mg | ORAL_TABLET | Freq: Three times a day (TID) | ORAL | 0 refills | Status: AC | PRN

## 2020-07-26 NOTE — Discharge Instructions (Signed)
No acute findings on x-ray of the lumbar spine and right knee.  Read and follow discharge care instructions.  Take medication as directed.  Do not operate vehicle or machinery while taking pain medications.
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
## 2020-07-26 NOTE — ED Notes (Signed)
W/c UDS done and walked to lab by Deno Etienne EDT
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
## 2023-03-27 ENCOUNTER — Emergency Department (HOSPITAL_BASED_OUTPATIENT_CLINIC_OR_DEPARTMENT_OTHER)
Admission: EM | Admit: 2023-03-27 | Discharge: 2023-03-28 | Disposition: A | Discharge status: Home / Self Care | Payer: Medicaid Other | Attending: Emergency Medicine | Admitting: Emergency Medicine

## 2023-03-27 ENCOUNTER — Other Ambulatory Visit: Payer: Self-pay

## 2023-03-27 ENCOUNTER — Encounter (HOSPITAL_BASED_OUTPATIENT_CLINIC_OR_DEPARTMENT_OTHER): Payer: Self-pay

## 2023-03-27 DIAGNOSIS — H00015 Hordeolum externum left lower eyelid: Secondary | ICD-10-CM | POA: Insufficient documentation

## 2023-03-27 DIAGNOSIS — H02845 Edema of left lower eyelid: Secondary | ICD-10-CM | POA: Diagnosis present

## 2023-03-27 MED ORDER — SULFAMETHOXAZOLE-TRIMETHOPRIM 800-160 MG PO TABS
1.0000 | ORAL_TABLET | Freq: Once | ORAL | Status: AC
  Administered 2023-03-28: 1 via ORAL
  Filled 2023-03-27: qty 1

## 2023-03-27 MED ORDER — SULFAMETHOXAZOLE-TRIMETHOPRIM 800-160 MG PO TABS: 1.0000 | ORAL_TABLET | Freq: Two times a day (BID) | ORAL | 0 refills | Status: AC

## 2023-03-27 NOTE — Discharge Instructions (Signed)
You were seen in the emerged department for an infection of the left eyelid It does appear as though you have a small abscess We were able to aspirate and drain a small amount of pus We started you on antibiotics Continue to apply warm compresses Follow-up with Dr. Allena Katz who is an ophthalmologist (eye specialist) if your symptoms or not getting better Return to the Emergency Department for changes in vision severe pain or any other concerns

## 2023-03-27 NOTE — ED Triage Notes (Signed)
Pt reports swelling to under right eye after he reports a mole fell off. Vision intact.

## 2023-03-27 NOTE — ED Provider Notes (Signed)
[place] [facility]MENT AT [facility] Provider Note   CSN: [id number] Arrival date & time: 03/27/23  1931     History  Chief Complaint  Patient presents with   Eye Problem    Chad Morrow is a 53 y.o. male.  Who presents to the ED for an eye complaint.  Patient reports an area of swelling over his left lower eyelid started 2 weeks ago and has progressed since the onset.  It is not significantly tender.  Warm compresses have provided little relief.  No changes in vision fevers chills or eye drainage   Eye Problem      Home Medications Prior to Admission medications   Medication Sig Start Date End Date Taking? Authorizing Provider  sulfamethoxazole-trimethoprim (BACTRIM DS) 800-160 MG tablet Take 1 tablet by mouth 2 (two) times daily for 7 days. 03/27/23 04/03/23 Yes Royanne Foots, DO  bisacodyl (BISACODYL) 5 MG EC tablet Take 5 mg by mouth daily as needed for moderate constipation.    [provider]  ibuprofen (ADVIL) 800 MG tablet Take 1 tablet (800 mg total) by mouth every 8 (eight) hours as needed for moderate pain. 07/26/20   Joni Reining, PA-C  magnesium citrate SOLN Take 1 Bottle by mouth once.    [provider]  naproxen sodium (ANAPROX) 220 MG tablet Take 440 mg by mouth daily as needed (for pain).    [provider]  orphenadrine (NORFLEX) 100 MG tablet Take 1 tablet (100 mg total) by mouth 2 (two) times daily. 07/26/20   Joni Reining, PA-C  oxyCODONE-acetaminophen (PERCOCET) 7.5-325 MG tablet Take 1 tablet by mouth every 6 (six) hours as needed. 07/26/20   Joni Reining, PA-C  PEDIALYTE (PEDIALYTE) SOLN Take 240 mLs by mouth daily as needed (for electrolyte replinishment).    [provider]  psyllium (METAMUCIL) 58.6 % powder Take 1 packet by mouth daily as needed (for constipation).    [provider]  Sodium Phosphates ([employer] ENEMA DISPOSABLE RE) Place 1 each rectally daily as needed (for  constipation).    [provider]      Allergies    Patient has no known allergies.    Review of Systems   Review of Systems  Physical Exam Updated Vital Signs BP (!) 157/111   Pulse 89   Temp 99 F (37.2 C)   Resp 17   Ht 5\' 9"  (1.753 m)   Wt 89.8 kg   SpO2 100%   BMI 29.24 kg/m  Physical Exam Vitals and nursing note reviewed.  HENT:     Head: Normocephalic and atraumatic.  Eyes:     General: No scleral icterus.       Left eye: No discharge.     Extraocular Movements: Extraocular movements intact.     Pupils: Pupils are equal, round, and reactive to light.     Comments: Large fluctuant hordeolum over left lower lid slightly tender to palpation. Normal external exam of left eye  Cardiovascular:     Rate and Rhythm: Normal rate and regular rhythm.  Pulmonary:     Effort: Pulmonary effort is normal.     Breath sounds: Normal breath sounds.  Abdominal:     Palpations: Abdomen is soft.     Tenderness: There is no abdominal tenderness.  Skin:    General: Skin is warm and dry.  Neurological:     Mental Status: He is alert.  Psychiatric:        Mood  and Affect: Mood normal.     ED Results / Procedures / Treatments   Labs (all labs ordered are listed, but only abnormal results are displayed) Labs Reviewed - No data to display  EKG None  Radiology No results found.  Procedures Procedures    Medications Ordered in ED Medications  sulfamethoxazole-trimethoprim (BACTRIM DS) 800-160 MG per tablet 1 tablet (has no administration in time range)    ED Course/ Medical Decision Making/ A&P                                 Medical Decision Making 53 year old male presenting for left eye complaint.  Large fluctuant hordeolum versus small abscess over left lower lid.  I was able to needle aspirate a small amount of purulent fluid from the eye which provided some relief and decrease size.  Will prescribe Bactrim and instructed him to continue warm  compresses.  Will provide ophthalmology follow-up if symptoms do not improve  Risk Prescription drug management.           Final Clinical Impression(s) / ED Diagnoses Final diagnoses:  Hordeolum externum of left lower eyelid    Rx / DC Orders ED Discharge Orders          Ordered    sulfamethoxazole-trimethoprim (BACTRIM DS) 800-160 MG tablet  2 times daily        03/27/23 2349              Royanne Foots, DO 03/28/23 0019

## 2023-03-27 NOTE — ED Provider Notes (Incomplete)
[name] [facility] AT [facility] Provider Note   CSN: [id number] Arrival date & time: 03/27/23  1931     History {Add pertinent medical, surgical, social history, OB history to HPI:1} Chief Complaint  Patient presents with  . Eye Problem    Chad Morrow is a 53 y.o. male.   Eye Problem      Home Medications Prior to Admission medications   Medication Sig Start Date End Date Taking? Authorizing Provider  sulfamethoxazole-trimethoprim (BACTRIM DS) 800-160 MG tablet Take 1 tablet by mouth 2 (two) times daily for 7 days. 03/27/23 04/03/23 Yes Royanne Foots, DO  bisacodyl (BISACODYL) 5 MG EC tablet Take 5 mg by mouth daily as needed for moderate constipation.    [provider]  ibuprofen (ADVIL) 800 MG tablet Take 1 tablet (800 mg total) by mouth every 8 (eight) hours as needed for moderate pain. 07/26/20   Joni Reining, PA-C  magnesium citrate SOLN Take 1 Bottle by mouth once.    [provider]  naproxen sodium (ANAPROX) 220 MG tablet Take 440 mg by mouth daily as needed (for pain).    [provider]  orphenadrine (NORFLEX) 100 MG tablet Take 1 tablet (100 mg total) by mouth 2 (two) times daily. 07/26/20   Joni Reining, PA-C  oxyCODONE-acetaminophen (PERCOCET) 7.5-325 MG tablet Take 1 tablet by mouth every 6 (six) hours as needed. 07/26/20   Joni Reining, PA-C  PEDIALYTE (PEDIALYTE) SOLN Take 240 mLs by mouth daily as needed (for electrolyte replinishment).    [provider]  psyllium (METAMUCIL) 58.6 % powder Take 1 packet by mouth daily as needed (for constipation).    [provider]  Sodium Phosphates ([employer] ENEMA DISPOSABLE RE) Place 1 each rectally daily as needed (for constipation).    [provider]      Allergies    Patient has no known allergies.    Review of Systems   Review of Systems  Physical Exam Updated Vital Signs BP (!) 157/111   Pulse 89   Temp 99 F (37.2 C)    Resp 17   Ht 5\' 9"  (1.753 m)   Wt 89.8 kg   SpO2 100%   BMI 29.24 kg/m  Physical Exam  ED Results / Procedures / Treatments   Labs (all labs ordered are listed, but only abnormal results are displayed) Labs Reviewed - No data to display  EKG None  Radiology No results found.  Procedures Procedures  {Document cardiac monitor, telemetry assessment procedure when appropriate:1}  Medications Ordered in ED Medications  sulfamethoxazole-trimethoprim (BACTRIM DS) 800-160 MG per tablet 1 tablet (has no administration in time range)    ED Course/ Medical Decision Making/ A&P   {   Click here for ABCD2, HEART and other calculatorsREFRESH Note before signing :1}                              Medical Decision Making Risk Prescription drug management.   ***  {Document critical care time when appropriate:1} {Document review of labs and clinical decision tools ie heart score, Chads2Vasc2 etc:1}  {Document your independent review of radiology images, and any outside records:1} {Document your discussion with family members, caretakers, and with consultants:1} {Document social determinants of health affecting pt's care:1} {Document your decision making why or why not admission, treatments were needed:1} Final Clinical Impression(s) / ED Diagnoses Final diagnoses:  Hordeolum externum of left lower  eyelid    Rx / DC Orders ED Discharge Orders          Ordered    sulfamethoxazole-trimethoprim (BACTRIM DS) 800-160 MG tablet  2 times daily        03/27/23 2349
